# Patient Record
Sex: Female | Born: 1955
Health system: Southern US, Community
[De-identification: ages and names within clinical notes are randomized; demographics above are authoritative.]

## PROBLEM LIST (undated history)

## (undated) DIAGNOSIS — F32A Depression, unspecified: Secondary | ICD-10-CM

## (undated) DIAGNOSIS — F329 Major depressive disorder, single episode, unspecified: Secondary | ICD-10-CM

## (undated) DIAGNOSIS — F909 Attention-deficit hyperactivity disorder, unspecified type: Secondary | ICD-10-CM

## (undated) DIAGNOSIS — M810 Age-related osteoporosis without current pathological fracture: Secondary | ICD-10-CM

## (undated) HISTORY — PX: REDUCTION MAMMAPLASTY: SUR839

## (undated) HISTORY — PX: CHOLECYSTECTOMY: SHX55

## (undated) HISTORY — DX: Age-related osteoporosis without current pathological fracture: M81.0

## (undated) HISTORY — PX: TUBAL LIGATION: SHX77

## (undated) HISTORY — PX: BREAST REDUCTION SURGERY: SHX8

## (undated) HISTORY — DX: Attention-deficit hyperactivity disorder, unspecified type: F90.9

## (undated) HISTORY — DX: Depression, unspecified: F32.A

## (undated) HISTORY — DX: Major depressive disorder, single episode, unspecified: F32.9

---

## 1997-12-29 ENCOUNTER — Other Ambulatory Visit: Admission: RE | Admit: 1997-12-29 | Discharge: 1997-12-29 | Payer: Self-pay | Admitting: *Deleted

## 1999-04-19 ENCOUNTER — Encounter: Admission: RE | Admit: 1999-04-19 | Discharge: 1999-04-19 | Payer: Self-pay | Admitting: *Deleted

## 2000-02-01 ENCOUNTER — Encounter: Admission: RE | Admit: 2000-02-01 | Discharge: 2000-02-01 | Payer: Self-pay | Admitting: *Deleted

## 2000-03-27 ENCOUNTER — Emergency Department (HOSPITAL_COMMUNITY): Admission: EM | Admit: 2000-03-27 | Discharge: 2000-03-27 | Payer: Self-pay

## 2000-03-27 ENCOUNTER — Encounter: Payer: Self-pay | Admitting: Emergency Medicine

## 2000-04-30 ENCOUNTER — Encounter: Admission: RE | Admit: 2000-04-30 | Discharge: 2000-04-30 | Payer: Self-pay | Admitting: Internal Medicine

## 2000-04-30 ENCOUNTER — Encounter: Payer: Self-pay | Admitting: Internal Medicine

## 2000-05-02 ENCOUNTER — Other Ambulatory Visit: Admission: RE | Admit: 2000-05-02 | Discharge: 2000-05-02 | Payer: Self-pay | Admitting: *Deleted

## 2000-05-15 ENCOUNTER — Encounter (INDEPENDENT_AMBULATORY_CARE_PROVIDER_SITE_OTHER): Payer: Self-pay | Admitting: Specialist

## 2000-05-15 ENCOUNTER — Observation Stay (HOSPITAL_COMMUNITY): Admission: RE | Admit: 2000-05-15 | Discharge: 2000-05-16 | Payer: Self-pay | Admitting: *Deleted

## 2001-06-19 ENCOUNTER — Other Ambulatory Visit: Admission: RE | Admit: 2001-06-19 | Discharge: 2001-06-19 | Payer: Self-pay | Admitting: *Deleted

## 2001-06-26 ENCOUNTER — Encounter: Admission: RE | Admit: 2001-06-26 | Discharge: 2001-06-26 | Payer: Self-pay | Admitting: *Deleted

## 2003-05-10 ENCOUNTER — Other Ambulatory Visit: Admission: RE | Admit: 2003-05-10 | Discharge: 2003-05-10 | Payer: Self-pay | Admitting: *Deleted

## 2004-07-05 ENCOUNTER — Encounter: Admission: RE | Admit: 2004-07-05 | Discharge: 2004-07-05 | Payer: Self-pay | Admitting: Internal Medicine

## 2005-02-26 ENCOUNTER — Other Ambulatory Visit: Admission: RE | Admit: 2005-02-26 | Discharge: 2005-02-26 | Payer: Self-pay | Admitting: Obstetrics and Gynecology

## 2005-04-01 ENCOUNTER — Encounter: Admission: RE | Admit: 2005-04-01 | Discharge: 2005-04-01 | Payer: Self-pay | Admitting: Obstetrics and Gynecology

## 2007-09-02 ENCOUNTER — Other Ambulatory Visit: Admission: RE | Admit: 2007-09-02 | Discharge: 2007-09-02 | Payer: Self-pay | Admitting: Internal Medicine

## 2007-09-10 ENCOUNTER — Encounter: Admission: RE | Admit: 2007-09-10 | Discharge: 2007-09-10 | Payer: Self-pay | Admitting: Internal Medicine

## 2009-08-23 ENCOUNTER — Emergency Department (HOSPITAL_COMMUNITY): Admission: EM | Admit: 2009-08-23 | Discharge: 2009-08-23 | Payer: Self-pay | Admitting: Emergency Medicine

## 2010-09-07 NOTE — Op Note (Signed)
[facility]  Patient:    [name], Julie Gregory                        MRN: [id number] Proc. Date: 05/15/00 Attending:  Catalina Lunger, M.D.                           Operative Report  PREOPERATIVE DIAGNOSIS:  Symptomatic cholelithiasis.  POSTOPERATIVE DIAGNOSIS:  Symptomatic cholelithiasis.  PROCEDURE:  Laparoscopic cholecystectomy.  SURGEON:  Danna Hefty, M.D.  ASSISTANT:  Avel Peace, M.D.  ANESTHESIA:  General.  DESCRIPTION OF PROCEDURE:  The patient was taken to the operating room and placed in the supine position. After adequate anesthesia was induced using endotracheal tube, the abdomen was prepped and draped in normal sterile fashion. Using a transverse infraumbilical incision, I dissected down the fascia. The fascia was opened vertically. An #0 Vicryl pursestring suture was placed around the fascial defect. A Hasson trocar was placed in the abdomen and the abdomen was insufflated with carbon dioxide. Under direct visualization, a 10 mm port was placed in the subxiphoid region and two 5 mm ports were placed in the right abdomen. The patient was placed in reverse Trendelenburg. The gallbladder was identified, grasped and retracted cephalad. Dissection began down on the infundibulum until the cystic duct was easily identified at its junction with the gallbladder and common bile duct were identified. The cystic duct was then triply clipped and divided. The cystic artery which lay directly posterior to this was also dissected free, triply clipped and divided. The gallbladder was taken off the gallbladder using Bovie electrocautery. There was virtually no mesentery on the gallbladder and there was a small hole made in the posterior wall of the gallbladder and therefore after it had been completely dissected off the gallbladder bed, it was placed in an endocatch bag and removed through the umbilical port. Adequate hemostasis was  ensured. The right upper quadrant was copiously irrigated. The abdomen was allowed to deflate. The #0 Vicryl pursestring suture closed the fascial defect in the infraumbilical region. All incisions were injected using 0.5 Marcaine. The skin incisions were closed with subcuticular 4-0 monocryl. Dermabond dressing was placed. The patient tolerated the procedure well and went to PACU in good condition. DD:  05/15/00 TD:  05/15/00 Job: [id number]

## 2011-05-09 ENCOUNTER — Other Ambulatory Visit: Payer: Self-pay | Admitting: Internal Medicine

## 2011-05-09 DIAGNOSIS — Z1231 Encounter for screening mammogram for malignant neoplasm of breast: Secondary | ICD-10-CM

## 2011-05-20 ENCOUNTER — Ambulatory Visit
Admission: RE | Admit: 2011-05-20 | Discharge: 2011-05-20 | Disposition: A | Discharge status: Home / Self Care | Payer: 59 | Source: Ambulatory Visit | Attending: Internal Medicine | Admitting: Internal Medicine

## 2011-05-20 DIAGNOSIS — Z1231 Encounter for screening mammogram for malignant neoplasm of breast: Secondary | ICD-10-CM

## 2014-10-06 ENCOUNTER — Other Ambulatory Visit: Payer: Self-pay | Admitting: Internal Medicine

## 2014-10-06 ENCOUNTER — Other Ambulatory Visit: Payer: Self-pay

## 2014-10-06 DIAGNOSIS — Z1231 Encounter for screening mammogram for malignant neoplasm of breast: Secondary | ICD-10-CM

## 2014-10-13 ENCOUNTER — Ambulatory Visit
Admission: RE | Admit: 2014-10-13 | Discharge: 2014-10-13 | Disposition: A | Discharge status: Home / Self Care | Payer: 59 | Source: Ambulatory Visit | Attending: Internal Medicine | Admitting: Internal Medicine

## 2014-10-13 DIAGNOSIS — Z1231 Encounter for screening mammogram for malignant neoplasm of breast: Secondary | ICD-10-CM

## 2014-11-08 ENCOUNTER — Other Ambulatory Visit (HOSPITAL_COMMUNITY)
Admission: RE | Admit: 2014-11-08 | Discharge: 2014-11-08 | Disposition: A | Discharge status: Home / Self Care | Payer: 59 | Source: Ambulatory Visit | Attending: Obstetrics & Gynecology | Admitting: Obstetrics & Gynecology

## 2014-11-08 ENCOUNTER — Other Ambulatory Visit: Payer: Self-pay | Admitting: Obstetrics & Gynecology

## 2014-11-08 DIAGNOSIS — Z1151 Encounter for screening for human papillomavirus (HPV): Secondary | ICD-10-CM | POA: Diagnosis present

## 2014-11-08 DIAGNOSIS — Z01419 Encounter for gynecological examination (general) (routine) without abnormal findings: Secondary | ICD-10-CM | POA: Diagnosis present

## 2014-11-09 LAB — CYTOLOGY - PAP

## 2014-12-23 ENCOUNTER — Encounter: Payer: Self-pay | Admitting: Gastroenterology

## 2015-02-24 ENCOUNTER — Ambulatory Visit (AMBULATORY_SURGERY_CENTER): Payer: Self-pay | Admitting: *Deleted

## 2015-02-24 VITALS — Ht 62.0 in | Wt 215.0 lb

## 2015-02-24 DIAGNOSIS — Z1211 Encounter for screening for malignant neoplasm of colon: Secondary | ICD-10-CM

## 2015-02-24 MED ORDER — NA SULFATE-K SULFATE-MG SULF 17.5-3.13-1.6 GM/177ML PO SOLN: 1.0000 | Freq: Once | ORAL | Status: DC

## 2015-02-24 NOTE — Progress Notes (Signed)
No egg or soy allergy. No anesthesia problems.  No home O2.  No diet meds.  

## 2015-02-28 ENCOUNTER — Encounter: Payer: Self-pay | Admitting: Gastroenterology

## 2015-03-10 ENCOUNTER — Ambulatory Visit (AMBULATORY_SURGERY_CENTER): Payer: Commercial Managed Care - HMO | Admitting: Gastroenterology

## 2015-03-10 ENCOUNTER — Encounter: Payer: Self-pay | Admitting: Gastroenterology

## 2015-03-10 VITALS — BP 120/74 | HR 70 | Temp 97.8°F | Resp 14 | Ht 62.0 in | Wt 215.0 lb

## 2015-03-10 DIAGNOSIS — Z1211 Encounter for screening for malignant neoplasm of colon: Secondary | ICD-10-CM | POA: Diagnosis not present

## 2015-03-10 MED ORDER — SODIUM CHLORIDE 0.9 % IV SOLN: 500.0000 mL | INTRAVENOUS | Status: DC

## 2015-03-10 NOTE — Progress Notes (Signed)
No problems noted in the recovery room. maw 

## 2015-03-10 NOTE — Op Note (Signed)
[place]  [address] [place], [address]   COLONOSCOPY PROCEDURE REPORT  PATIENT: Julie Gregory, Julie Gregory  MR#: [id number] BIRTHDATE: 1956-01-21 , 84  yrs. old GENDER: female ENDOSCOPIST: Yetta Flock, MD REFERRED BY: PROCEDURE DATE:  03/10/2015 PROCEDURE:   Colonoscopy, screening First Screening Colonoscopy - Avg.  risk and is 50 yrs.  old or older Yes.  Prior Negative Screening - Now for repeat screening. N/A  History of Adenoma - Now for follow-up colonoscopy & has been > or = to 3 yrs.  N/A  Recommend repeat exam, <10 yrs? No ASA CLASS:   Class II INDICATIONS:Screening for colonic neoplasia and Colorectal Neoplasm Risk Assessment for this procedure is average risk. MEDICATIONS: Propofol 200 mg IV  DESCRIPTION OF PROCEDURE:   After the risks benefits and alternatives of the procedure were thoroughly explained, informed consent was obtained.  The digital rectal exam revealed no abnormalities of the rectum.   The LB [id number]  endoscope was introduced through the anus and advanced to the cecum, which was identified by both the appendix and ileocecal valve. No adverse events experienced.   The quality of the prep was good.  The instrument was then slowly withdrawn as the colon was fully examined. Estimated blood loss is zero unless otherwise noted in this procedure report.   COLON FINDINGS: A normal appearing cecum, ileocecal valve, and appendiceal orifice were identified.  The ascending, transverse, descending, sigmoid colon, and rectum appeared unremarkable.  No polyps or mas lesions noted. Retroflexed views revealed small internal hemorrhoids. The time to cecum = 3.2 Withdrawal time = 10.5   The scope was withdrawn and the procedure completed. COMPLICATIONS: There were no immediate complications.  ENDOSCOPIC IMPRESSION: Normal colonoscopy  RECOMMENDATIONS: Resume diet Resume medications Repeat colonoscopy in 10 years for screening  purposes  eSigned:  Yetta Flock, MD 03/10/2015 9:52 AM   cc: the patient

## 2015-03-10 NOTE — Progress Notes (Signed)
To recovery, report to Willis, RN, VSS 

## 2015-03-10 NOTE — Patient Instructions (Signed)
YOU HAD AN ENDOSCOPIC PROCEDURE TODAY AT THE [facility]:   Refer to the procedure report that was given to you for any specific questions about what was found during the examination.  If the procedure report does not answer your questions, please call your gastroenterologist to clarify.  If you requested that your care partner not be given the details of your procedure findings, then the procedure report has been included in a sealed envelope for you to review at your convenience later.  YOU SHOULD EXPECT: Some feelings of bloating in the abdomen. Passage of more gas than usual.  Walking can help get rid of the air that was put into your GI tract during the procedure and reduce the bloating. If you had a lower endoscopy (such as a colonoscopy or flexible sigmoidoscopy) you may notice spotting of blood in your stool or on the toilet paper. If you underwent a bowel prep for your procedure, you may not have a normal bowel movement for a few days.  Please Note:  You might notice some irritation and congestion in your nose or some drainage.  This is from the oxygen used during your procedure.  There is no need for concern and it should clear up in a day or so.  SYMPTOMS TO REPORT IMMEDIATELY:   Following lower endoscopy (colonoscopy or flexible sigmoidoscopy):  Excessive amounts of blood in the stool  Significant tenderness or worsening of abdominal pains  Swelling of the abdomen that is new, acute  Fever of 100F or higher   For urgent or emergent issues, a gastroenterologist can be reached at any hour by calling [phone].   DIET: Your first meal following the procedure should be a small meal and then it is ok to progress to your normal diet. Heavy or fried foods are harder to digest and may make you feel nauseous or bloated.  Likewise, meals heavy in dairy and vegetables can increase bloating.  Drink plenty of fluids but you should avoid alcoholic beverages for 24  hours.  ACTIVITY:  You should plan to take it easy for the rest of today and you should NOT DRIVE or use heavy machinery until tomorrow (because of the sedation medicines used during the test).    FOLLOW UP: Our staff will call the number listed on your records the next business day following your procedure to check on you and address any questions or concerns that you may have regarding the information given to you following your procedure. If we do not reach you, we will leave a message.  However, if you are feeling well and you are not experiencing any problems, there is no need to return our call.  We will assume that you have returned to your regular daily activities without incident.  If any biopsies were taken you will be contacted by phone or by letter within the next 1-3 weeks.  Please call us at [phone] if you have not heard about the biopsies in 3 weeks.    SIGNATURES/CONFIDENTIALITY: You and/or your care partner have signed paperwork which will be entered into your electronic medical record.  These signatures attest to the fact that that the information above on your After Visit Summary has been reviewed and is understood.  Full responsibility of the confidentiality of this discharge information lies with you and/or your care-partner.    Handouts were given to your care partner on hemorrhoids and a high fiber diet with liberal fluid intake You may resume your   current medications today. Please call if any questions or concerns.

## 2015-03-13 ENCOUNTER — Telehealth: Payer: Self-pay | Admitting: *Deleted

## 2015-03-13 NOTE — Telephone Encounter (Signed)
  Follow up Call-  Call back number 03/10/2015  Post procedure Call Back phone  # [phone]  Permission to leave phone message Yes     Patient questions:  Do you have a fever, pain , or abdominal swelling? No. Pain Score  0 *  Have you tolerated food without any problems? Yes.    Have you been able to return to your normal activities? Yes.    Do you have any questions about your discharge instructions: Diet   No. Medications  No. Follow up visit  No.  Do you have questions or concerns about your Care? No.  Actions: * If pain score is 4 or above: No action needed, pain <4.

## 2015-11-16 ENCOUNTER — Encounter (HOSPITAL_BASED_OUTPATIENT_CLINIC_OR_DEPARTMENT_OTHER): Payer: Self-pay | Admitting: *Deleted

## 2015-11-16 ENCOUNTER — Emergency Department (HOSPITAL_BASED_OUTPATIENT_CLINIC_OR_DEPARTMENT_OTHER)
Admission: EM | Admit: 2015-11-16 | Discharge: 2015-11-16 | Disposition: A | Discharge status: Home / Self Care | Payer: Commercial Managed Care - HMO | Attending: Emergency Medicine | Admitting: Emergency Medicine

## 2015-11-16 DIAGNOSIS — F329 Major depressive disorder, single episode, unspecified: Secondary | ICD-10-CM | POA: Diagnosis not present

## 2015-11-16 DIAGNOSIS — N390 Urinary tract infection, site not specified: Secondary | ICD-10-CM

## 2015-11-16 DIAGNOSIS — M810 Age-related osteoporosis without current pathological fracture: Secondary | ICD-10-CM | POA: Insufficient documentation

## 2015-11-16 DIAGNOSIS — R319 Hematuria, unspecified: Secondary | ICD-10-CM

## 2015-11-16 DIAGNOSIS — Z79899 Other long term (current) drug therapy: Secondary | ICD-10-CM | POA: Insufficient documentation

## 2015-11-16 LAB — URINALYSIS, ROUTINE W REFLEX MICROSCOPIC
Bilirubin Urine: NEGATIVE
Glucose, UA: NEGATIVE mg/dL
KETONES UR: NEGATIVE mg/dL
NITRITE: NEGATIVE
PH: 6 (ref 5.0–8.0)
Specific Gravity, Urine: 1.021 (ref 1.005–1.030)

## 2015-11-16 LAB — URINE MICROSCOPIC-ADD ON

## 2015-11-16 MED ORDER — FOSFOMYCIN TROMETHAMINE 3 G PO PACK
3.0000 g | PACK | Freq: Once | ORAL | Status: AC
  Administered 2015-11-16: 3 g via ORAL
  Filled 2015-11-16: qty 3

## 2015-11-16 MED ORDER — PHENAZOPYRIDINE HCL 200 MG PO TABS: 200.0000 mg | ORAL_TABLET | Freq: Three times a day (TID) | ORAL | 0 refills | Status: DC

## 2015-11-16 NOTE — ED Provider Notes (Signed)
[facility] DEPT Provider Note   CSN: [id number] Arrival date & time: 11/16/15  2112  First Provider Contact:  First MD Initiated Contact with Patient 11/16/15 2201     By signing my name below, I, Soijett Blue, attest that this documentation has been prepared under the direction and in the presence of Deno Etienne, DO. Electronically Signed: [place], ED Scribe. 11/16/15. 10:05 PM.    History   Chief Complaint Chief Complaint  Patient presents with  . Hematuria    HPI Julie Gregory is a 60 y.o. female who presents to the Emergency Department complaining of hematuria onset today. She states that she is having associated symptoms of urinary frequency and dysuria. She states that she has not tried any medications for the relief for her symptoms. She denies fever, n/v, back pain, and any other symptoms.   The history is provided by the patient. No language interpreter was used.  Hematuria  This is a new problem. The current episode started 12 to 24 hours ago. The problem occurs rarely. The problem has not changed since onset.Nothing aggravates the symptoms. Nothing relieves the symptoms. The treatment provided no relief.    Past Medical History:  Diagnosis Date  . ADHD (attention deficit hyperactivity disorder)   . Depression   . Osteoporosis     There are no active problems to display for this patient.   Past Surgical History:  Procedure Laterality Date  . BREAST REDUCTION SURGERY    . CHOLECYSTECTOMY    . TUBAL LIGATION      OB History    No data available       Home Medications    Prior to Admission medications   Medication Sig Start Date End Date Taking? Authorizing Provider  BuPROPion HCl (WELLBUTRIN PO) Take by mouth.   Yes Historical Provider, MD  alendronate (FOSAMAX) 70 MG tablet Take 70 mg by mouth once a week. Take with a full glass of water on an empty stomach.    Historical Provider, MD  amphetamine-dextroamphetamine (ADDERALL) 30 MG tablet  Take 30 mg by mouth daily.    Historical Provider, MD  Multiple Vitamins-Minerals (MULTIVITAMIN ADULT PO) Take by mouth.    Historical Provider, MD  phenazopyridine (PYRIDIUM) 200 MG tablet Take 1 tablet (200 mg total) by mouth 3 (three) times daily. 11/16/15   Deno Etienne, DO    Family History Family History  Problem Relation Age of Onset  . Colon cancer Neg Hx     Social History Social History  Substance Use Topics  . Smoking status: Never Smoker  . Smokeless tobacco: Never Used  . Alcohol use 0.0 oz/week     Comment: occasional     Allergies   Review of patient's allergies indicates no known allergies.   Review of Systems Review of Systems  Constitutional: Negative for fever.  Gastrointestinal: Negative for nausea and vomiting.  Genitourinary: Positive for dysuria, frequency and hematuria.  Musculoskeletal: Negative for back pain.  All other systems reviewed and are negative.    Physical Exam Updated Vital Signs BP 150/78   Pulse 93   Temp 98.5 F (36.9 C) (Oral)   Resp 20   Ht 5\' 3"  (1.6 m)   Wt 207 lb (93.9 kg)   SpO2 96%   BMI 36.67 kg/m   Physical Exam  Constitutional: She is oriented to person, place, and time. She appears well-developed and well-nourished. No distress.  HENT:  Head: Normocephalic and atraumatic.  Eyes: EOM are normal.  Neck:  Neck supple.  Cardiovascular: Normal rate.   Pulmonary/Chest: Effort normal. No respiratory distress.  Abdominal: Soft. She exhibits no distension. There is no tenderness. There is no CVA tenderness.  No CVA tenderness  Musculoskeletal: Normal range of motion.  Neurological: She is alert and oriented to person, place, and time.  Skin: Skin is warm and dry.  Psychiatric: She has a normal mood and affect. Her behavior is normal.  Nursing note and vitals reviewed.    ED Treatments / Results  DIAGNOSTIC STUDIES: Oxygen Saturation is 96% on RA, nl by my interpretation.    COORDINATION OF CARE: 10:04 PM  Discussed treatment plan with pt at bedside which includes UA and pt agreed to plan.   Labs (all labs ordered are listed, but only abnormal results are displayed) Labs Reviewed  URINALYSIS, ROUTINE W REFLEX MICROSCOPIC (NOT AT [facility]) - Abnormal; Notable for the following:       Result Value   Color, Urine RED (*)    APPearance TURBID (*)    Hgb urine dipstick LARGE (*)    Protein, ur >300 (*)    Leukocytes, UA MODERATE (*)    All other components within normal limits  URINE MICROSCOPIC-ADD ON - Abnormal; Notable for the following:    Squamous Epithelial / LPF 6-30 (*)    Bacteria, UA FEW (*)    All other components within normal limits    Procedures Procedures (including critical care time)  Medications Ordered in ED Medications  fosfomycin (MONUROL) packet 3 g (3 g Oral Given 11/16/15 2220)     Initial Impression / Assessment and Plan / ED Course  I have reviewed the triage vital signs and the nursing notes.  Pertinent labs that were available during my care of the patient were reviewed by me and considered in my medical decision making (see chart for details).  Clinical Course    60 yo F with dysuria.  UA with UTI, given fosfomycin.  PCP follow up.    4:05 PM:  I have discussed the diagnosis/risks/treatment options with the patient and family and believe the pt to be eligible for discharge home to follow-up with PCP. We also discussed returning to the ED immediately if new or worsening sx occur. We discussed the sx which are most concerning (e.g., sudden worsening pain, fever, back pain, continued hematuria) that necessitate immediate return. Medications administered to the patient during their visit and any new prescriptions provided to the patient are listed below.  Medications given during this visit Medications  fosfomycin (MONUROL) packet 3 g (3 g Oral Given 11/16/15 2220)     The patient appears reasonably screen and/or stabilized for discharge and I doubt any  other medical condition or other [facility] requiring further screening, evaluation, or treatment in the ED at this time prior to discharge.    Final Clinical Impressions(s) / ED Diagnoses   Final diagnoses:  UTI (lower urinary tract infection)  Hematuria    New Prescriptions Discharge Medication List as of 11/16/2015 10:06 PM    START taking these medications   Details  phenazopyridine (PYRIDIUM) 200 MG tablet Take 1 tablet (200 mg total) by mouth 3 (three) times daily., Starting Thu 11/16/2015, Print        I personally performed the services described in this documentation, which was scribed in my presence. The recorded information has been reviewed and is accurate.     Deno Etienne, DO 11/17/15 1605

## 2015-11-16 NOTE — ED Triage Notes (Signed)
Hematuria, urgency and frequency today.

## 2015-11-16 NOTE — ED Notes (Signed)
C/o urinary freq,  burning w urination and blood in urine onset this pm

## 2016-01-16 ENCOUNTER — Other Ambulatory Visit: Payer: Self-pay | Admitting: Urology

## 2016-01-16 DIAGNOSIS — R310 Gross hematuria: Secondary | ICD-10-CM

## 2016-01-16 DIAGNOSIS — R3989 Other symptoms and signs involving the genitourinary system: Secondary | ICD-10-CM | POA: Insufficient documentation

## 2016-01-16 DIAGNOSIS — R8290 Unspecified abnormal findings in urine: Secondary | ICD-10-CM

## 2016-01-22 ENCOUNTER — Ambulatory Visit
Admission: RE | Admit: 2016-01-22 | Discharge: 2016-01-22 | Disposition: A | Discharge status: Home / Self Care | Payer: Commercial Managed Care - HMO | Source: Ambulatory Visit | Attending: Urology | Admitting: Urology

## 2016-01-22 ENCOUNTER — Other Ambulatory Visit: Payer: Self-pay | Admitting: Urology

## 2016-01-22 DIAGNOSIS — R8290 Unspecified abnormal findings in urine: Secondary | ICD-10-CM

## 2016-01-22 DIAGNOSIS — R3989 Other symptoms and signs involving the genitourinary system: Secondary | ICD-10-CM

## 2016-01-22 DIAGNOSIS — R310 Gross hematuria: Secondary | ICD-10-CM

## 2016-01-22 MED ORDER — IOPAMIDOL (ISOVUE-300) INJECTION 61%
125.0000 mL | Freq: Once | INTRAVENOUS | Status: AC | PRN
  Administered 2016-01-22: 125 mL via INTRAVENOUS

## 2016-07-26 ENCOUNTER — Other Ambulatory Visit: Payer: Self-pay | Admitting: Internal Medicine

## 2016-07-26 DIAGNOSIS — Z1231 Encounter for screening mammogram for malignant neoplasm of breast: Secondary | ICD-10-CM

## 2016-08-16 ENCOUNTER — Ambulatory Visit
Admission: RE | Admit: 2016-08-16 | Discharge: 2016-08-16 | Disposition: A | Discharge status: Home / Self Care | Payer: Commercial Managed Care - HMO | Source: Ambulatory Visit | Attending: Internal Medicine | Admitting: Internal Medicine

## 2016-08-16 DIAGNOSIS — Z1231 Encounter for screening mammogram for malignant neoplasm of breast: Secondary | ICD-10-CM

## 2016-10-09 DIAGNOSIS — Z0289 Encounter for other administrative examinations: Secondary | ICD-10-CM | POA: Diagnosis not present

## 2016-10-09 DIAGNOSIS — Z79899 Other long term (current) drug therapy: Secondary | ICD-10-CM | POA: Diagnosis not present

## 2017-01-03 DIAGNOSIS — J181 Lobar pneumonia, unspecified organism: Secondary | ICD-10-CM | POA: Diagnosis not present

## 2017-01-10 ENCOUNTER — Other Ambulatory Visit: Payer: Self-pay | Admitting: Nurse Practitioner

## 2017-01-10 ENCOUNTER — Ambulatory Visit
Admission: RE | Admit: 2017-01-10 | Discharge: 2017-01-10 | Disposition: A | Discharge status: Home / Self Care | Payer: Self-pay | Source: Ambulatory Visit | Attending: Nurse Practitioner | Admitting: Nurse Practitioner

## 2017-01-10 DIAGNOSIS — J189 Pneumonia, unspecified organism: Secondary | ICD-10-CM

## 2017-01-10 DIAGNOSIS — R05 Cough: Secondary | ICD-10-CM | POA: Diagnosis not present

## 2017-01-10 DIAGNOSIS — J153 Pneumonia due to streptococcus, group B: Secondary | ICD-10-CM | POA: Diagnosis not present

## 2017-02-06 DIAGNOSIS — M8588 Other specified disorders of bone density and structure, other site: Secondary | ICD-10-CM | POA: Diagnosis not present

## 2017-02-06 DIAGNOSIS — M810 Age-related osteoporosis without current pathological fracture: Secondary | ICD-10-CM | POA: Diagnosis not present

## 2017-02-14 DIAGNOSIS — Z0000 Encounter for general adult medical examination without abnormal findings: Secondary | ICD-10-CM | POA: Diagnosis not present

## 2017-02-14 DIAGNOSIS — M810 Age-related osteoporosis without current pathological fracture: Secondary | ICD-10-CM | POA: Diagnosis not present

## 2017-03-26 DIAGNOSIS — Z01419 Encounter for gynecological examination (general) (routine) without abnormal findings: Secondary | ICD-10-CM | POA: Diagnosis not present

## 2017-07-19 DIAGNOSIS — J018 Other acute sinusitis: Secondary | ICD-10-CM | POA: Diagnosis not present

## 2017-07-19 DIAGNOSIS — J302 Other seasonal allergic rhinitis: Secondary | ICD-10-CM | POA: Diagnosis not present

## 2017-07-19 DIAGNOSIS — R030 Elevated blood-pressure reading, without diagnosis of hypertension: Secondary | ICD-10-CM | POA: Diagnosis not present

## 2017-08-05 DIAGNOSIS — J0100 Acute maxillary sinusitis, unspecified: Secondary | ICD-10-CM | POA: Diagnosis not present

## 2017-08-05 DIAGNOSIS — J209 Acute bronchitis, unspecified: Secondary | ICD-10-CM | POA: Diagnosis not present

## 2017-12-16 ENCOUNTER — Other Ambulatory Visit: Payer: Self-pay | Admitting: Internal Medicine

## 2017-12-16 DIAGNOSIS — Z1231 Encounter for screening mammogram for malignant neoplasm of breast: Secondary | ICD-10-CM

## 2017-12-26 DIAGNOSIS — L570 Actinic keratosis: Secondary | ICD-10-CM | POA: Diagnosis not present

## 2017-12-26 DIAGNOSIS — I788 Other diseases of capillaries: Secondary | ICD-10-CM | POA: Diagnosis not present

## 2017-12-26 DIAGNOSIS — L430 Hypertrophic lichen planus: Secondary | ICD-10-CM | POA: Diagnosis not present

## 2017-12-26 DIAGNOSIS — L821 Other seborrheic keratosis: Secondary | ICD-10-CM | POA: Diagnosis not present

## 2017-12-26 DIAGNOSIS — D485 Neoplasm of uncertain behavior of skin: Secondary | ICD-10-CM | POA: Diagnosis not present

## 2017-12-26 DIAGNOSIS — L814 Other melanin hyperpigmentation: Secondary | ICD-10-CM | POA: Diagnosis not present

## 2018-01-08 ENCOUNTER — Ambulatory Visit
Admission: RE | Admit: 2018-01-08 | Discharge: 2018-01-08 | Disposition: A | Discharge status: Home / Self Care | Payer: 59 | Source: Ambulatory Visit | Attending: Internal Medicine | Admitting: Internal Medicine

## 2018-01-08 ENCOUNTER — Ambulatory Visit: Payer: 59

## 2018-01-08 DIAGNOSIS — Z1231 Encounter for screening mammogram for malignant neoplasm of breast: Secondary | ICD-10-CM | POA: Diagnosis not present

## 2018-02-18 DIAGNOSIS — Z136 Encounter for screening for cardiovascular disorders: Secondary | ICD-10-CM | POA: Diagnosis not present

## 2018-02-18 DIAGNOSIS — Z0000 Encounter for general adult medical examination without abnormal findings: Secondary | ICD-10-CM | POA: Diagnosis not present

## 2018-02-18 DIAGNOSIS — Z23 Encounter for immunization: Secondary | ICD-10-CM | POA: Diagnosis not present

## 2018-03-30 DIAGNOSIS — J0100 Acute maxillary sinusitis, unspecified: Secondary | ICD-10-CM | POA: Diagnosis not present

## 2018-03-30 DIAGNOSIS — J029 Acute pharyngitis, unspecified: Secondary | ICD-10-CM | POA: Diagnosis not present

## 2018-03-30 DIAGNOSIS — J040 Acute laryngitis: Secondary | ICD-10-CM | POA: Diagnosis not present

## 2018-04-06 DIAGNOSIS — Z01419 Encounter for gynecological examination (general) (routine) without abnormal findings: Secondary | ICD-10-CM | POA: Diagnosis not present

## 2019-01-20 ENCOUNTER — Other Ambulatory Visit (HOSPITAL_COMMUNITY): Payer: Self-pay | Admitting: Orthopedic Surgery

## 2019-02-25 ENCOUNTER — Encounter (HOSPITAL_BASED_OUTPATIENT_CLINIC_OR_DEPARTMENT_OTHER): Payer: Self-pay | Admitting: *Deleted

## 2019-02-25 ENCOUNTER — Other Ambulatory Visit: Payer: Self-pay

## 2019-02-26 ENCOUNTER — Encounter (HOSPITAL_BASED_OUTPATIENT_CLINIC_OR_DEPARTMENT_OTHER)
Admission: RE | Admit: 2019-02-26 | Discharge: 2019-02-26 | Disposition: A | Discharge status: Home / Self Care | Payer: 59 | Source: Ambulatory Visit | Attending: Orthopedic Surgery | Admitting: Orthopedic Surgery

## 2019-02-26 DIAGNOSIS — Z01812 Encounter for preprocedural laboratory examination: Secondary | ICD-10-CM | POA: Diagnosis not present

## 2019-02-26 LAB — SURGICAL PCR SCREEN
MRSA, PCR: NEGATIVE
Staphylococcus aureus: NEGATIVE

## 2019-02-26 NOTE — Progress Notes (Signed)

## 2019-03-01 ENCOUNTER — Other Ambulatory Visit (HOSPITAL_COMMUNITY)
Admission: RE | Admit: 2019-03-01 | Discharge: 2019-03-01 | Disposition: A | Discharge status: Home / Self Care | Payer: 59 | Source: Ambulatory Visit | Attending: Orthopedic Surgery | Admitting: Orthopedic Surgery

## 2019-03-01 DIAGNOSIS — Z20828 Contact with and (suspected) exposure to other viral communicable diseases: Secondary | ICD-10-CM | POA: Diagnosis not present

## 2019-03-01 DIAGNOSIS — Z01812 Encounter for preprocedural laboratory examination: Secondary | ICD-10-CM | POA: Insufficient documentation

## 2019-03-02 LAB — NOVEL CORONAVIRUS, NAA (HOSP ORDER, SEND-OUT TO REF LAB; TAT 18-24 HRS): SARS-CoV-2, NAA: NOT DETECTED

## 2019-03-04 ENCOUNTER — Ambulatory Visit (HOSPITAL_BASED_OUTPATIENT_CLINIC_OR_DEPARTMENT_OTHER): Payer: 59 | Admitting: Certified Registered"

## 2019-03-04 ENCOUNTER — Encounter (HOSPITAL_BASED_OUTPATIENT_CLINIC_OR_DEPARTMENT_OTHER)
Admission: RE | Disposition: A | Discharge status: Home / Self Care | Payer: Self-pay | Source: Home / Self Care | Attending: Orthopedic Surgery

## 2019-03-04 ENCOUNTER — Ambulatory Visit (HOSPITAL_COMMUNITY): Payer: 59

## 2019-03-04 ENCOUNTER — Other Ambulatory Visit: Payer: Self-pay

## 2019-03-04 ENCOUNTER — Encounter (HOSPITAL_BASED_OUTPATIENT_CLINIC_OR_DEPARTMENT_OTHER): Payer: Self-pay | Admitting: Certified Registered"

## 2019-03-04 ENCOUNTER — Ambulatory Visit (HOSPITAL_BASED_OUTPATIENT_CLINIC_OR_DEPARTMENT_OTHER)
Admission: RE | Admit: 2019-03-04 | Discharge: 2019-03-05 | Disposition: A | Discharge status: Home / Self Care | Payer: 59 | Attending: Orthopedic Surgery | Admitting: Orthopedic Surgery

## 2019-03-04 DIAGNOSIS — Z79899 Other long term (current) drug therapy: Secondary | ICD-10-CM | POA: Insufficient documentation

## 2019-03-04 DIAGNOSIS — M19171 Post-traumatic osteoarthritis, right ankle and foot: Secondary | ICD-10-CM | POA: Diagnosis not present

## 2019-03-04 DIAGNOSIS — F329 Major depressive disorder, single episode, unspecified: Secondary | ICD-10-CM | POA: Diagnosis not present

## 2019-03-04 DIAGNOSIS — Z419 Encounter for procedure for purposes other than remedying health state, unspecified: Secondary | ICD-10-CM

## 2019-03-04 HISTORY — PX: TOTAL ANKLE ARTHROPLASTY: SHX811

## 2019-03-04 SURGERY — ARTHROPLASTY, ANKLE, TOTAL
Anesthesia: General | Site: Ankle | Laterality: Right

## 2019-03-04 MED ORDER — METOCLOPRAMIDE HCL 5 MG/ML IJ SOLN: 5.0000 mg | Freq: Three times a day (TID) | INTRAMUSCULAR | Status: DC | PRN

## 2019-03-04 MED ORDER — HYDROMORPHONE HCL 1 MG/ML IJ SOLN: 0.5000 mg | INTRAMUSCULAR | Status: DC | PRN

## 2019-03-04 MED ORDER — OXYCODONE HCL 5 MG/5ML PO SOLN: 5.0000 mg | Freq: Once | ORAL | Status: DC | PRN

## 2019-03-04 MED ORDER — FENTANYL CITRATE (PF) 100 MCG/2ML IJ SOLN: 25.0000 ug | INTRAMUSCULAR | Status: DC | PRN

## 2019-03-04 MED ORDER — PROPOFOL 10 MG/ML IV BOLUS
INTRAVENOUS | Status: DC | PRN
  Administered 2019-03-04: 150 mg via INTRAVENOUS

## 2019-03-04 MED ORDER — METHOCARBAMOL 1000 MG/10ML IJ SOLN: 500.0000 mg | Freq: Four times a day (QID) | INTRAVENOUS | Status: DC | PRN

## 2019-03-04 MED ORDER — CEFAZOLIN SODIUM-DEXTROSE 2-4 GM/100ML-% IV SOLN
2.0000 g | INTRAVENOUS | Status: AC
  Administered 2019-03-04: 2 g via INTRAVENOUS

## 2019-03-04 MED ORDER — OXYCODONE HCL 5 MG PO TABS: 5.0000 mg | ORAL_TABLET | Freq: Once | ORAL | Status: DC | PRN

## 2019-03-04 MED ORDER — OXYCODONE HCL 5 MG PO TABS: 10.0000 mg | ORAL_TABLET | ORAL | Status: DC | PRN

## 2019-03-04 MED ORDER — CHLORHEXIDINE GLUCONATE 4 % EX LIQD: 60.0000 mL | Freq: Once | CUTANEOUS | Status: DC

## 2019-03-04 MED ORDER — ONDANSETRON HCL 4 MG/2ML IJ SOLN
INTRAMUSCULAR | Status: DC | PRN
  Administered 2019-03-04: 4 mg via INTRAVENOUS

## 2019-03-04 MED ORDER — MIDAZOLAM HCL 2 MG/2ML IJ SOLN
1.0000 mg | INTRAMUSCULAR | Status: DC | PRN
  Administered 2019-03-04: 07:00:00 2 mg via INTRAVENOUS

## 2019-03-04 MED ORDER — LACTATED RINGERS IV SOLN
INTRAVENOUS | Status: DC
  Administered 2019-03-04 (×2): via INTRAVENOUS

## 2019-03-04 MED ORDER — ONDANSETRON HCL 4 MG/2ML IJ SOLN: 4.0000 mg | Freq: Once | INTRAMUSCULAR | Status: DC | PRN

## 2019-03-04 MED ORDER — ONDANSETRON HCL 4 MG PO TABS: 4.0000 mg | ORAL_TABLET | Freq: Four times a day (QID) | ORAL | Status: DC | PRN

## 2019-03-04 MED ORDER — ROPIVACAINE HCL 5 MG/ML IJ SOLN
INTRAMUSCULAR | Status: DC | PRN
  Administered 2019-03-04: 30 mL via PERINEURAL
  Administered 2019-03-04: 15 mL via PERINEURAL

## 2019-03-04 MED ORDER — SENNA 8.6 MG PO TABS: 1.0000 | ORAL_TABLET | Freq: Two times a day (BID) | ORAL | Status: DC

## 2019-03-04 MED ORDER — LORATADINE 10 MG PO TABS: 10.0000 mg | ORAL_TABLET | Freq: Every day | ORAL | Status: DC

## 2019-03-04 MED ORDER — OXYCODONE HCL 5 MG PO TABS: 5.0000 mg | ORAL_TABLET | ORAL | Status: DC | PRN

## 2019-03-04 MED ORDER — LIDOCAINE HCL (CARDIAC) PF 100 MG/5ML IV SOSY
PREFILLED_SYRINGE | INTRAVENOUS | Status: DC | PRN
  Administered 2019-03-04: 30 mg via INTRAVENOUS

## 2019-03-04 MED ORDER — MAGNESIUM 400 MG PO TABS: ORAL_TABLET | Freq: Every morning | ORAL | Status: DC

## 2019-03-04 MED ORDER — SENNA 8.6 MG PO TABS: 2.0000 | ORAL_TABLET | Freq: Two times a day (BID) | ORAL | 0 refills | Status: AC

## 2019-03-04 MED ORDER — VANCOMYCIN HCL 500 MG IV SOLR
INTRAVENOUS | Status: DC | PRN
  Administered 2019-03-04: 500 mg via TOPICAL

## 2019-03-04 MED ORDER — ONDANSETRON HCL 4 MG/2ML IJ SOLN: 4.0000 mg | Freq: Four times a day (QID) | INTRAMUSCULAR | Status: DC | PRN

## 2019-03-04 MED ORDER — SODIUM CHLORIDE 0.9 % IV SOLN
INTRAVENOUS | Status: DC
  Administered 2019-03-04: 11:00:00 via INTRAVENOUS

## 2019-03-04 MED ORDER — SODIUM CHLORIDE 0.9 % IV SOLN: INTRAVENOUS | Status: DC

## 2019-03-04 MED ORDER — METOCLOPRAMIDE HCL 5 MG PO TABS: 5.0000 mg | ORAL_TABLET | Freq: Three times a day (TID) | ORAL | Status: DC | PRN

## 2019-03-04 MED ORDER — DOCUSATE SODIUM 100 MG PO CAPS: 100.0000 mg | ORAL_CAPSULE | Freq: Two times a day (BID) | ORAL | 0 refills | Status: AC

## 2019-03-04 MED ORDER — METHOCARBAMOL 500 MG PO TABS: 500.0000 mg | ORAL_TABLET | Freq: Four times a day (QID) | ORAL | Status: DC | PRN

## 2019-03-04 MED ORDER — DEXAMETHASONE SODIUM PHOSPHATE 10 MG/ML IJ SOLN
INTRAMUSCULAR | Status: DC | PRN
  Administered 2019-03-04: 5 mg via INTRAVENOUS
  Administered 2019-03-04: 5 mg

## 2019-03-04 MED ORDER — MIDAZOLAM HCL 2 MG/2ML IJ SOLN
INTRAMUSCULAR | Status: AC
  Filled 2019-03-04: qty 2

## 2019-03-04 MED ORDER — POTASSIUM AMINOBENZOATE 500 MG PO CAPS: ORAL_CAPSULE | Freq: Every morning | ORAL | Status: DC

## 2019-03-04 MED ORDER — FENTANYL CITRATE (PF) 100 MCG/2ML IJ SOLN
INTRAMUSCULAR | Status: AC
  Filled 2019-03-04: qty 2

## 2019-03-04 MED ORDER — CEFAZOLIN SODIUM-DEXTROSE 2-4 GM/100ML-% IV SOLN
INTRAVENOUS | Status: AC
  Filled 2019-03-04: qty 100

## 2019-03-04 MED ORDER — ASPIRIN EC 81 MG PO TBEC: 81.0000 mg | DELAYED_RELEASE_TABLET | Freq: Two times a day (BID) | ORAL | 0 refills | Status: AC

## 2019-03-04 MED ORDER — FENTANYL CITRATE (PF) 100 MCG/2ML IJ SOLN
50.0000 ug | INTRAMUSCULAR | Status: DC | PRN
  Administered 2019-03-04: 07:00:00 50 ug via INTRAVENOUS

## 2019-03-04 MED ORDER — ACETAMINOPHEN 325 MG PO TABS: 325.0000 mg | ORAL_TABLET | Freq: Four times a day (QID) | ORAL | Status: DC | PRN

## 2019-03-04 MED ORDER — DOCUSATE SODIUM 100 MG PO CAPS: 100.0000 mg | ORAL_CAPSULE | Freq: Two times a day (BID) | ORAL | Status: DC

## 2019-03-04 MED ORDER — PROPOFOL 500 MG/50ML IV EMUL
INTRAVENOUS | Status: DC | PRN
  Administered 2019-03-04: 25 ug/kg/min via INTRAVENOUS

## 2019-03-04 MED ORDER — OXYCODONE HCL 5 MG PO TABS: 5.0000 mg | ORAL_TABLET | ORAL | 0 refills | Status: AC | PRN

## 2019-03-04 SURGICAL SUPPLY — 89 items
APL PRP STRL LF DISP 70% ISPRP (MISCELLANEOUS) ×1
BANDAGE ESMARK 6X9 LF (GAUZE/BANDAGES/DRESSINGS) IMPLANT
BIT DRILL 2.9 CANN QC NONSTRL (BIT) ×2 IMPLANT
BLADE RECIPRO TAPERED (BLADE) ×3 IMPLANT
BLADE SURG 15 STRL LF DISP TIS (BLADE) ×3 IMPLANT
BLADE SURG 15 STRL SS (BLADE) ×9
BNDG CMPR 9X6 STRL LF SNTH (GAUZE/BANDAGES/DRESSINGS)
BNDG COHESIVE 4X5 TAN STRL (GAUZE/BANDAGES/DRESSINGS) ×3 IMPLANT
BNDG ELASTIC 4X5.8 VLCR STR LF (GAUZE/BANDAGES/DRESSINGS) ×3 IMPLANT
BNDG ELASTIC 6X5.8 VLCR STR LF (GAUZE/BANDAGES/DRESSINGS) IMPLANT
BNDG ESMARK 6X9 LF (GAUZE/BANDAGES/DRESSINGS)
CHLORAPREP W/TINT 26 (MISCELLANEOUS) ×3 IMPLANT
CLIP LOCKING VANTAGE SZ 2 (Clip) ×2 IMPLANT
COMP TALAR VT SZ 2 RT (Ankle) ×3 IMPLANT
COMPONENT TALAR VT SZ 2 RT (Ankle) IMPLANT
COVER BACK TABLE REUSABLE LG (DRAPES) ×6 IMPLANT
COVER MAYO STAND REUSABLE (DRAPES) ×3 IMPLANT
COVER WAND RF STERILE (DRAPES) IMPLANT
CUFF TOURN SGL QUICK 34 (TOURNIQUET CUFF) ×3
CUFF TRNQT CYL 34X4.125X (TOURNIQUET CUFF) IMPLANT
DECANTER SPIKE VIAL GLASS SM (MISCELLANEOUS) IMPLANT
DRAPE C-ARM 42X72 X-RAY (DRAPES) ×3 IMPLANT
DRAPE C-ARMOR (DRAPES) ×3 IMPLANT
DRAPE EXTREMITY T 121X128X90 (DISPOSABLE) ×3 IMPLANT
DRAPE HALF SHEET 70X43 (DRAPES) ×3 IMPLANT
DRAPE IMP U-DRAPE 54X76 (DRAPES) ×2 IMPLANT
DRAPE OEC MINIVIEW 54X84 (DRAPES) IMPLANT
DRAPE U-SHAPE 47X51 STRL (DRAPES) ×3 IMPLANT
DRSG MEPILEX BORDER 4X4 (GAUZE/BANDAGES/DRESSINGS) ×3 IMPLANT
DRSG MEPILEX BORDER 4X8 (GAUZE/BANDAGES/DRESSINGS) IMPLANT
DRSG MEPITEL 4X7.2 (GAUZE/BANDAGES/DRESSINGS) ×3 IMPLANT
DRSG PAD ABDOMINAL 8X10 ST (GAUZE/BANDAGES/DRESSINGS) ×6 IMPLANT
ELECT REM PT RETURN 9FT ADLT (ELECTROSURGICAL) ×3
ELECTRODE REM PT RTRN 9FT ADLT (ELECTROSURGICAL) ×1 IMPLANT
GAUZE SPONGE 4X4 12PLY STRL (GAUZE/BANDAGES/DRESSINGS) ×3 IMPLANT
GLOVE BIO SURGEON STRL SZ8 (GLOVE) ×3 IMPLANT
GLOVE BIOGEL PI IND STRL 7.0 (GLOVE) IMPLANT
GLOVE BIOGEL PI IND STRL 8 (GLOVE) ×2 IMPLANT
GLOVE BIOGEL PI INDICATOR 7.0 (GLOVE) ×4
GLOVE BIOGEL PI INDICATOR 8 (GLOVE) ×4
GLOVE ECLIPSE 6.5 STRL STRAW (GLOVE) ×2 IMPLANT
GLOVE ECLIPSE 8.0 STRL XLNG CF (GLOVE) ×3 IMPLANT
GOWN STRL REUS W/ TWL LRG LVL3 (GOWN DISPOSABLE) ×1 IMPLANT
GOWN STRL REUS W/ TWL XL LVL3 (GOWN DISPOSABLE) ×1 IMPLANT
GOWN STRL REUS W/TWL LRG LVL3 (GOWN DISPOSABLE) ×3
GOWN STRL REUS W/TWL XL LVL3 (GOWN DISPOSABLE) ×6 IMPLANT
INSERT TIB FB ANKLE SZ 2X10 RT (Miscellaneous) ×2 IMPLANT
K-WIRE ACE 1.6X6 (WIRE) ×3
KWIRE ACE 1.6X6 (WIRE) IMPLANT
NEEDLE HYPO 22GX1.5 SAFETY (NEEDLE) IMPLANT
NS IRRIG 1000ML POUR BTL (IV SOLUTION) ×3 IMPLANT
PACK BASIN DAY SURGERY FS (CUSTOM PROCEDURE TRAY) ×3 IMPLANT
PAD CAST 4YDX4 CTTN HI CHSV (CAST SUPPLIES) ×2 IMPLANT
PADDING CAST ABS 4INX4YD NS (CAST SUPPLIES)
PADDING CAST ABS COTTON 4X4 ST (CAST SUPPLIES) IMPLANT
PADDING CAST COTTON 4X4 STRL (CAST SUPPLIES) ×6
PADDING CAST COTTON 6X4 STRL (CAST SUPPLIES) ×3 IMPLANT
PENCIL SMOKE EVACUATOR (MISCELLANEOUS) ×3 IMPLANT
PIN POUCH TALAR VANTAGE 2.0 (PIN) ×2 IMPLANT
PIN POUCH TALAR VANTAGE 2.5 (PIN) ×2 IMPLANT
PIN POUCH TALAR VANTAGE 3.5 (PIN) ×4 IMPLANT
PLATE TIB BEARING VT SZ 2 RT (Plate) ×2 IMPLANT
POUCH SCREW TALAR ANKLE (ORTHOPEDIC DISPOSABLE SUPPLIES) ×2 IMPLANT
SANITIZER HAND PURELL 535ML FO (MISCELLANEOUS) ×3 IMPLANT
SAW BLADE (BLADE) ×3 IMPLANT
SAW STRYKER ANKLE 10X75X1.19 (BLADE) ×1 IMPLANT
SCOTCHCAST PLUS 3X4 WHITE (CAST SUPPLIES) IMPLANT
SCOTCHCAST PLUS 4X4 WHITE (CAST SUPPLIES) ×6 IMPLANT
SCREW ACE CAN 4.0 40M (Screw) ×2 IMPLANT
SLEEVE SCD COMPRESS KNEE MED (MISCELLANEOUS) ×3 IMPLANT
SPONGE LAP 18X18 RF (DISPOSABLE) ×3 IMPLANT
STOCKINETTE 6  STRL (DRAPES) ×2
STOCKINETTE 6 STRL (DRAPES) ×1 IMPLANT
SUCTION FRAZIER HANDLE 10FR (MISCELLANEOUS) ×2
SUCTION TUBE FRAZIER 10FR DISP (MISCELLANEOUS) ×1 IMPLANT
SURGILUBE 2OZ TUBE FLIPTOP (MISCELLANEOUS) ×3 IMPLANT
SUT ETHILON 3 0 PS 1 (SUTURE) ×6 IMPLANT
SUT MNCRL AB 3-0 PS2 18 (SUTURE) ×6 IMPLANT
SUT VIC AB 0 CT1 27 (SUTURE) ×3
SUT VIC AB 0 CT1 27XBRD ANBCTR (SUTURE) ×1 IMPLANT
SUT VIC AB 2-0 SH 27 (SUTURE)
SUT VIC AB 2-0 SH 27XBRD (SUTURE) IMPLANT
SYR 20ML LL LF (SYRINGE) IMPLANT
SYR BULB IRRIGATION 50ML (SYRINGE) ×3 IMPLANT
TOWEL GREEN STERILE FF (TOWEL DISPOSABLE) ×10 IMPLANT
TUBE CONNECTING 20'X1/4 (TUBING) ×1
TUBE CONNECTING 20X1/4 (TUBING) ×2 IMPLANT
UNDERPAD 30X36 HEAVY ABSORB (UNDERPADS AND DIAPERS) ×3 IMPLANT
YANKAUER SUCT BULB TIP NO VENT (SUCTIONS) ×3 IMPLANT

## 2019-03-04 NOTE — Anesthesia Postprocedure Evaluation (Signed)
Anesthesia Post Note  Patient: Julie Gregory  Procedure(s) Performed: TOTAL ANKLE ARTHOPLASTY (Right Ankle)     Patient location during evaluation: PACU Anesthesia Type: General Level of consciousness: awake and alert Pain management: pain level controlled Vital Signs Assessment: post-procedure vital signs reviewed and stable Respiratory status: spontaneous breathing, nonlabored ventilation and respiratory function stable Cardiovascular status: blood pressure returned to baseline and stable Postop Assessment: no apparent nausea or vomiting Anesthetic complications: no    Last Vitals:  Vitals:   03/04/19 1100 03/04/19 1200  BP: 115/64 119/65  Pulse: 76 78  Resp: 18   Temp: (!) 36.4 C   SpO2: 97% 98%    Last Pain:  Vitals:   03/04/19 1200  TempSrc:   PainSc: 0-No pain                 Lidia Collum

## 2019-03-04 NOTE — Anesthesia Procedure Notes (Signed)
Anesthesia Regional Block: Adductor canal block   Pre-Anesthetic Checklist: ,, timeout performed, Correct Patient, Correct Site, Correct Laterality, Correct Procedure, Correct Position, site marked, Risks and benefits discussed,  Surgical consent,  Pre-op evaluation,  At surgeon's request and post-op pain management  Laterality: Right  Prep: chloraprep       Needles:  Injection technique: Single-shot  Needle Type: Echogenic Stimulator Needle     Needle Length: 10cm  Needle Gauge: 21     Additional Needles:   Procedures:,,,, ultrasound used (permanent image in chart),,,,  Narrative:  Start time: 03/04/2019 7:05 AM End time: 03/04/2019 7:10 AM Injection made incrementally with aspirations every 5 mL.  Performed by: Personally  Anesthesiologist: Lidia Collum, MD  Additional Notes: Monitors applied. Injection made in 5cc increments. No resistance to injection. Good needle visualization. Patient tolerated procedure well.

## 2019-03-04 NOTE — H&P (Signed)
Julie Gregory is an 63 y.o. female.   Chief Complaint:  Right ankle pain HPI:  The patient is a 63 y/o female without significant PMH.  She has a long h/o R ankle pain due to post traumatic arthritis.  SHe has failed non op treatment to date including activity modification, oral NSAIDs, bracing and therapy and presents now for right total ankle replacement.  Past Medical History:  Diagnosis Date  . ADHD (attention deficit hyperactivity disorder)   . Depression   . Osteoporosis     Past Surgical History:  Procedure Laterality Date  . BREAST REDUCTION SURGERY    . CHOLECYSTECTOMY    . REDUCTION MAMMAPLASTY    . TUBAL LIGATION      Family History  Problem Relation Age of Onset  . Colon cancer Neg Hx   . Breast cancer Neg Hx    Social History:  reports that she has never smoked. She has never used smokeless tobacco. She reports current alcohol use. She reports that she does not use drugs.  Allergies: No Known Allergies  Medications Prior to Admission  Medication Sig Dispense Refill  . b complex vitamins capsule Take 1 capsule by mouth daily.    . BuPROPion HCl (WELLBUTRIN PO) Take by mouth.    . calcium-vitamin D (OSCAL WITH D) 500-200 MG-UNIT tablet Take 1 tablet by mouth.    . Lactobacillus (PROBIOTIC ACIDOPHILUS PO) Take by mouth.    . loratadine (CLARITIN) 10 MG tablet Take 10 mg by mouth daily.    . Magnesium 400 MG TABS Take by mouth.    . Multiple Vitamins-Minerals (MULTIVITAMIN ADULT PO) Take by mouth.    . Potassium Aminobenzoate 500 MG CAPS Take by mouth.    . Turmeric 500 MG CAPS Take by mouth.    . Zinc 50 MG CAPS Take by mouth.      No results found for this or any previous visit (from the past 48 hour(s)). No results found.  ROS  No recent f/c/n/v/w tloss  Blood pressure 116/64, pulse 75, temperature 98.3 F (36.8 C), temperature source Oral, resp. rate 11, height 5\' 3"  (1.6 m), weight 88.6 kg, SpO2 98 %. Physical Exam  wn wd woman in nad.  A and O x 4.   Mood and affect normal.  EOMI.  resp unalbored.  R ankle with healthy skin.  No lymphadneopathy.  Decreased ROM at the ankel.  5/5 strength in PF and DF of the ankle and toes.  Sens to LT intact anteriorly.  Assessment/Plan R ankle post traumatic arthritis - to OR for right total ankle replacement.  The risks and benefits of the alternative treatment options have been discussed in detail.  The patient wishes to proceed with surgery and specifically understands risks of bleeding, infection, nerve damage, blood clots, need for additional surgery, amputation and death.   [name], MD 04-03-19, 7:23 AM

## 2019-03-04 NOTE — Anesthesia Procedure Notes (Signed)
Anesthesia Regional Block: Popliteal block   Pre-Anesthetic Checklist: ,, timeout performed, Correct Patient, Correct Site, Correct Laterality, Correct Procedure, Correct Position, site marked, Risks and benefits discussed,  Surgical consent,  Pre-op evaluation,  At surgeon's request and post-op pain management  Laterality: Right  Prep: chloraprep       Needles:  Injection technique: Single-shot  Needle Type: Echogenic Stimulator Needle     Needle Length: 10cm  Needle Gauge: 21     Additional Needles:   Procedures:,,,, ultrasound used (permanent image in chart),,,,  Narrative:  Start time: 03/04/2019 7:10 AM End time: 03/04/2019 7:12 AM Injection made incrementally with aspirations every 5 mL.  Performed by: Personally  Anesthesiologist: Lidia Collum, MD  Additional Notes: Monitors applied. Injection made in 5cc increments. No resistance to injection. Good needle visualization. Patient tolerated procedure well.

## 2019-03-04 NOTE — Anesthesia Preprocedure Evaluation (Addendum)
Anesthesia Evaluation  Patient identified by MRN, date of birth, ID band Patient awake    Reviewed: Allergy & Precautions, NPO status , Patient's Chart, lab work & pertinent test results  History of Anesthesia Complications Negative for: history of anesthetic complications  Airway Mallampati: II  TM Distance: >3 FB Neck ROM: Full    Dental  (+) Teeth Intact   Pulmonary neg pulmonary ROS,    Pulmonary exam normal        Cardiovascular negative cardio ROS Normal cardiovascular exam     Neuro/Psych PSYCHIATRIC DISORDERS Depression negative neurological ROS     GI/Hepatic negative GI ROS, Neg liver ROS,   Endo/Other  negative endocrine ROS  Renal/GU negative Renal ROS  negative genitourinary   Musculoskeletal negative musculoskeletal ROS (+)   Abdominal   Peds  Hematology negative hematology ROS (+)   Anesthesia Other Findings   Reproductive/Obstetrics                            Anesthesia Physical Anesthesia Plan  ASA: II  Anesthesia Plan: General   Post-op Pain Management: GA combined w/ Regional for post-op pain   Induction: Intravenous  PONV Risk Score and Plan: 3 and Ondansetron, Dexamethasone, Treatment may vary due to age or medical condition and Midazolam  Airway Management Planned: LMA  Additional Equipment: None  Intra-op Plan:   Post-operative Plan: Extubation in OR  Informed Consent: I have reviewed the patients History and Physical, chart, labs and discussed the procedure including the risks, benefits and alternatives for the proposed anesthesia with the patient or authorized representative who has indicated his/her understanding and acceptance.     Dental advisory given  Plan Discussed with:   Anesthesia Plan Comments:        Anesthesia Quick Evaluation

## 2019-03-04 NOTE — Progress Notes (Signed)
Assisted Dr. Christella Hartigan with right, ultrasound guided, popliteal, adductor canal block. Side rails up, monitors on throughout procedure. See vital signs in flow sheet. Tolerated Procedure well.

## 2019-03-04 NOTE — Anesthesia Procedure Notes (Signed)
Procedure Name: LMA Insertion Date/Time: 03/04/2019 7:33 AM Performed by: Signe Colt, CRNA Pre-anesthesia Checklist: Patient identified, Emergency Drugs available, Suction available and Patient being monitored Patient Re-evaluated:Patient Re-evaluated prior to induction Oxygen Delivery Method: Circle system utilized Preoxygenation: Pre-oxygenation with 100% oxygen Induction Type: IV induction Ventilation: Mask ventilation without difficulty LMA: LMA inserted LMA Size: 4.0 Number of attempts: 1 Airway Equipment and Method: Bite block Placement Confirmation: positive ETCO2 Tube secured with: Tape Dental Injury: Teeth and Oropharynx as per pre-operative assessment

## 2019-03-04 NOTE — Discharge Instructions (Addendum)
[name], MD [facility]  Please read the following information regarding your care after surgery.  Medications  You only need a prescription for the narcotic pain medicine (ex. oxycodone, Percocet, Norco).  All of the other medicines listed below are available over the counter. X Aleve 2 pills twice a day for the first 3 days after surgery. X acetominophen (Tylenol) 650 mg every 4-6 hours as you need for minor to moderate pain X oxycodone as prescribed for severe pain  Narcotic pain medicine (ex. oxycodone, Percocet, Vicodin) will cause constipation.  To prevent this problem, take the following medicines while you are taking any pain medicine. X docusate sodium (Colace) 100 mg twice a day     X senna (Senokot) 2 tablets twice a day  X To help prevent blood clots, take a baby aspirin (81 mg) twice a day after surgery.  You should also get up every hour while you are awake to move around.    Weight Bearing X Do not bear any weight on the operated leg or foot.  Cast / Splint / Dressing X Keep your splint, cast or dressing clean and dry.  Dont put anything (coat hanger, pencil, etc) down inside of it.  If it gets damp, use a hair dryer on the cool setting to dry it.  If it gets soaked, call the office to schedule an appointment for a cast change.  After your dressing, cast or splint is removed; you may shower, but do not soak or scrub the wound.  Allow the water to run over it, and then gently pat it dry.  Swelling It is normal for you to have swelling where you had surgery.  To reduce swelling and pain, keep your toes above your nose for at least 3 days after surgery.  It may be necessary to keep your foot or leg elevated for several weeks.  If it hurts, it should be elevated.  Follow Up Call my office at [phone] when you are discharged from the hospital or surgery center to schedule an appointment to be seen two weeks after surgery.  Call my office at [phone] if you develop  a fever >101.5 F, nausea, vomiting, bleeding from the surgical site or severe pain.    Regional Anesthesia Blocks  1. Numbness or the inability to move the "blocked" extremity may last from 3-48 hours after placement. The length of time depends on the medication injected and your individual response to the medication. If the numbness is not going away after 48 hours, call your surgeon.  2. The extremity that is blocked will need to be protected until the numbness is gone and the  Strength has returned. Because you cannot feel it, you will need to take extra care to avoid injury. Because it may be weak, you may have difficulty moving it or using it. You may not know what position it is in without looking at it while the block is in effect.  3. For blocks in the legs and feet, returning to weight bearing and walking needs to be done carefully. You will need to wait until the numbness is entirely gone and the strength has returned. You should be able to move your leg and foot normally before you try and bear weight or walk. You will need someone to be with you when you first try to ensure you do not fall and possibly risk injury.  4. Bruising and tenderness at the needle site are common side effects and will resolve  in a few days.  5. Persistent numbness or new problems with movement should be communicated to the surgeon or the [facility] [phone] [place] [phone]).

## 2019-03-04 NOTE — Op Note (Signed)
03/04/2019  9:54 AM  PATIENT:  Julie Gregory  63 y.o. female  PRE-OPERATIVE DIAGNOSIS:  right ankle post traumatic arthritis  POST-OPERATIVE DIAGNOSIS:  same  Procedure(s): Right TOTAL ANKLE ARTHOPLASTY  SURGEON:  [name], MD  ASSISTANT: Mechele Claude, PA-C  ANESTHESIA:   General, regional  EBL:  minimal   TOURNIQUET:   Total Tourniquet Time Documented: Thigh (Right) - 112 minutes Total: Thigh (Right) - [id number] minutes  COMPLICATIONS:  Intra op fracture of the medial malleolus  DISPOSITION:  Extubated, awake and stable to recovery.  INDICATION FOR PROCEDURE: The patient is a 63 year old female with a long history of right ankle pain due to posttraumatic arthritis.  She has failed nonoperative treatment to date including activity modification, oral anti-inflammatories and shoewear modification as well as bracing and therapy.  She presents now for operative treatment of this painful and limiting condition.  The risks and benefits of the alternative treatment options have been discussed in detail.  The patient wishes to proceed with surgery and specifically understands risks of bleeding, infection, nerve damage, blood clots, need for additional surgery, amputation and death.  PROCEDURE IN DETAIL:  After pre operative consent was obtained, and the correct operative site was identified, the patient was brought to the operating room and placed supine on the OR table.  Anesthesia was administered.  Pre-operative antibiotics were administered.  A surgical timeout was taken.  The right lower extremity was prepped and draped in standard sterile fashion with a tourniquet around the thigh.  The extremity was elevated and the tourniquet was inflated to 250 mmHg.  An anterior incision was made over the extensor houses longus tendon.  Dissection was carried down through the subcutaneous tissues taking care to protect branches of the superficial peroneal nerve distally.  The extensor retinaculum was  incised over the EHL and released proximally and distally.  The interval between the tibialis anterior and EHL tendons was developed.  The neurovascular bundle was identified.  It was retracted laterally and protected throughout the case.  The anterior ankle joint capsule was incised and elevated medially and laterally.  Patient was noted to have some varus malalignment through the ankle joint with a tight deltoid ligament.  Subperiosteal dissection was then carried distal to the medial malleolus releasing the deep deltoid fibers.  The anterior osteophytes were resected with a rondure.  The tibial alignment guide was fixed to the proximal tibia with a guidepin in line with the medial gutter.  The alignment guide was then positioned over the tibial crest to the level of the medial corner of the mortise.  It was provisionally pinned.  AP and lateral radiographs confirmed appropriate alignment, and the proximal block was pinned.  Rotation was set and the proximal block secured.  AP and lateral radiographs confirmed varus valgus alignment and appropriate resection level.  The distal block was then pinned in line with the medial gutter.  The reciprocating saw was used to cut the medial gutter.  The oscillating saw was then used to cut the distal tibia.  Both guide pins were removed.  A curved osteotome was used to remove the distal cut bone.  At this point a fracture of the medial malleolus was identified.  A guidepin was inserted from the tip of the medial malleolus to the metaphyseal bone across the fracture site.  The guidepin was overdrilled and a 4 mm x 40 mm partially-threaded cannulated screw was inserted from the [employer] small frag set.  The screw was noted  to have excellent purchase and compressed the fracture site appropriately.  Attention was returned to the ankle joint.  The talar cutting guide was inserted and provisionally pinned after confirming appropriate alignment on the lateral fluoroscopic  view.  The talar cut was made.  The talus was sized as a #2.  The tibia was sized as a #2 as well.  The talar cutting block was inserted and pinned.  The chamfer cuts were made and the 2 anterior slots were milled.  The cut surfaces were smoothed with a rasp.  The trial tibia was inserted and was noted to fit appropriately.  the 2 lug holes were then drilled.  The trial tibia was inserted.  It was also noted to fit appropriately.  Rotation was set.  It was pinned and then further tightened.  The 3 peripheral lug holes were punched followed by the middle lug hole.  The trial was removed.  The wound was irrigated copiously.  Vancomycin powder was sprinkled on the base of the tibial implant.  It was inserted and impacted into position.  A lateral radiograph confirmed appropriate position of the tibial component.  The talar component was then sprinkled with vancomycin powder and inserted.  It was impacted.  Again a lateral radiograph confirmed appropriate position.  Trial poly was inserted.  The ankle was noted to have appropriate range of motion and medial and lateral ligament balance.  The wound was again irrigated copiously after removing the trial.  A 10 mm polyimplant was inserted followed by the locking clip.  Final AP, lateral and mortise radiographs confirmed appropriate position and length of all hardware and appropriate reduction and fixation of the medial malleolus fracture.  Vancomycin powder was sprinkled in the deep portion of the wound.  The anterior capsule was repaired with 0 Vicryl.  The extensor retinaculum was repaired with 0 Vicryl.  Subcutaneous tissues were then approximated with 3-0 Monocryl.  The skin incision was closed with running 3-0 nylon.  Sterile dressings were applied followed by a well-padded short leg cast.  The tourniquet was released after application of the dressings.  The patient was awakened from anesthesia and transported to the recovery room in stable condition.  FOLLOW UP  PLAN: The patient will be observed overnight for pain control.  She will be nonweightbearing on the right lower extremity in a short leg cast for a total of 6 weeks postop.  Follow-up with me in the office in 3 weeks for suture removal.  Aspirin for DVT prophylaxis.     Mechele Claude PA-C was present and scrubbed for the duration of the operative case. His assistance was essential in positioning the patient, prepping and draping, gaining and maintaining exposure, performing the operation, closing and dressing the wounds and applying the splint.

## 2019-03-04 NOTE — Transfer of Care (Signed)
Immediate Anesthesia Transfer of Care Note  Patient: Julie Gregory  Procedure(s) Performed: TOTAL ANKLE ARTHOPLASTY (Right Ankle)  Patient Location: PACU  Anesthesia Type:GA combined with regional for post-op pain  Level of Consciousness: drowsy and patient cooperative  Airway & Oxygen Therapy: Patient Spontanous Breathing and Patient connected to face mask oxygen  Post-op Assessment: Report given to RN and Post -op Vital signs reviewed and stable  Post vital signs: Reviewed and stable  Last Vitals:  Vitals Value Taken Time  BP    Temp    Pulse 80 03/04/19 0952  Resp    SpO2 97 % 03/04/19 0952    Last Pain:  Vitals:   03/04/19 0638  TempSrc: Oral  PainSc: 0-No pain         Complications: No apparent anesthesia complications

## 2019-03-05 NOTE — Discharge Summary (Signed)
Physician Discharge Summary  Patient ID: Julie Gregory MRN: [id number] DOB/AGE: [birth year] 63 y.o.  Admit date: 03/04/2019 Discharge date: 03/05/2019  Admission Diagnoses: post traumatic ankle arthritis; hx of osteoporosis, depression, ADHD.  Discharge Diagnoses:  Active Problems:   * No active hospital problems. * Same as above  Discharged Condition: stable  Hospital Course: Patient presented to [facility] for elective right total ankle replacement by Dr. [name] on March 04, 2019.  The patient tolerated the procedure well without any complications.  She stayed overnight for pain control and observation.  She tolerated her stay well without any complications.  She is discharged home on March 05, 2019.  Consults: N/A  Significant Diagnostic Studies: radiology: X-Ray: to ensure satisfactory anatomic alignment during operative procedure.  Treatments: IV hydration, antibiotics: Ancef, analgesia: acetaminophen, Vicodin and Dilaudid and surgery: as stated above.  Discharge Exam: Blood pressure 109/63, pulse 83, temperature 98.3 F (36.8 C), resp. rate 18, height 5\' 3"  (1.6 m), weight 88.6 kg, SpO2 97 %.   Disposition: Discharge disposition: 01-Home or Self Care       Discharge Instructions    Call MD / Call 911   Complete by: As directed    If you experience chest pain or shortness of breath, CALL 911 and be transported to the hospital emergency room.  If you develope a fever above 101 F, pus (white drainage) or increased drainage or redness at the wound, or calf pain, call your surgeon's office.   Constipation Prevention   Complete by: As directed    Drink plenty of fluids.  Prune juice may be helpful.  You may use a stool softener, such as Colace (over the counter) 100 mg twice a day.  Use MiraLax (over the counter) for constipation as needed.   Diet - low sodium heart healthy   Complete by: As directed    Increase activity slowly as  tolerated   Complete by: As directed    Non weight bearing   Complete by: As directed    Laterality: right   Extremity: Lower     Allergies as of 03/05/2019   No Known Allergies     Medication List    TAKE these medications   aspirin EC 81 MG tablet Take 1 tablet (81 mg total) by mouth 2 (two) times daily.   b complex vitamins capsule Take 1 capsule by mouth daily.   calcium-vitamin D 500-200 MG-UNIT tablet Commonly known as: OSCAL WITH D Take 1 tablet by mouth.   docusate sodium 100 MG capsule Commonly known as: Colace Take 1 capsule (100 mg total) by mouth 2 (two) times daily. While taking narcotic pain medicine.   loratadine 10 MG tablet Commonly known as: CLARITIN Take 10 mg by mouth daily.   Magnesium 400 MG Tabs Take by mouth.   MULTIVITAMIN ADULT PO Take by mouth.   oxyCODONE 5 MG immediate release tablet Commonly known as: Roxicodone Take 1 tablet (5 mg total) by mouth every 4 (four) hours as needed for up to 5 days.   Potassium Aminobenzoate 500 MG Caps Take by mouth.   PROBIOTIC ACIDOPHILUS PO Take by mouth.   senna 8.6 MG Tabs tablet Commonly known as: SENOKOT Take 2 tablets (17.2 mg total) by mouth 2 (two) times daily.   Turmeric 500 MG Caps Take by mouth.   WELLBUTRIN PO Take by mouth.   Zinc 50 MG Caps Take by mouth.  Discharge Care Instructions  (From admission, onward)         Start     Ordered   03/04/19 0000  Non weight bearing    Question Answer Comment  Laterality right   Extremity Lower      03/04/19 0957         Follow-up Information    [name], MD. Schedule an appointment as soon as possible for a visit in 3 weeks.   Specialty: Orthopedic Surgery Contact information: [address] [place] [address] [phone]           Signed: Mohammed Kindle Office:  [phone]

## 2019-03-11 ENCOUNTER — Encounter (HOSPITAL_BASED_OUTPATIENT_CLINIC_OR_DEPARTMENT_OTHER): Payer: Self-pay | Admitting: Orthopedic Surgery

## 2019-04-07 ENCOUNTER — Other Ambulatory Visit: Payer: Self-pay | Admitting: Obstetrics & Gynecology

## 2019-04-07 DIAGNOSIS — N6459 Other signs and symptoms in breast: Secondary | ICD-10-CM

## 2019-05-12 ENCOUNTER — Ambulatory Visit
Admission: RE | Admit: 2019-05-12 | Discharge: 2019-05-12 | Disposition: A | Discharge status: Home / Self Care | Payer: 59 | Source: Ambulatory Visit | Attending: Obstetrics & Gynecology | Admitting: Obstetrics & Gynecology

## 2019-05-12 ENCOUNTER — Other Ambulatory Visit: Payer: Self-pay

## 2019-05-12 DIAGNOSIS — N6459 Other signs and symptoms in breast: Secondary | ICD-10-CM

## 2020-05-12 IMAGING — MG DIGITAL DIAGNOSTIC BILAT W/ TOMO W/ CAD
6 of 10 series · 6 of 30 positions shown · non-contrast
Comparison: Previous exam(s).

ACR Breast Density Category a: The breast tissue is almost entirely
fatty.

CLINICAL DATA: 63-year-old female presenting for annual exam as
well as for a palpable area of concern felt by her doctor in the
inferior right breast.

EXAM:
DIGITAL DIAGNOSTIC BILATERAL MAMMOGRAM WITH CAD AND TOMO
ULTRASOUND RIGHT BREAST

[L MLO synth-2D]
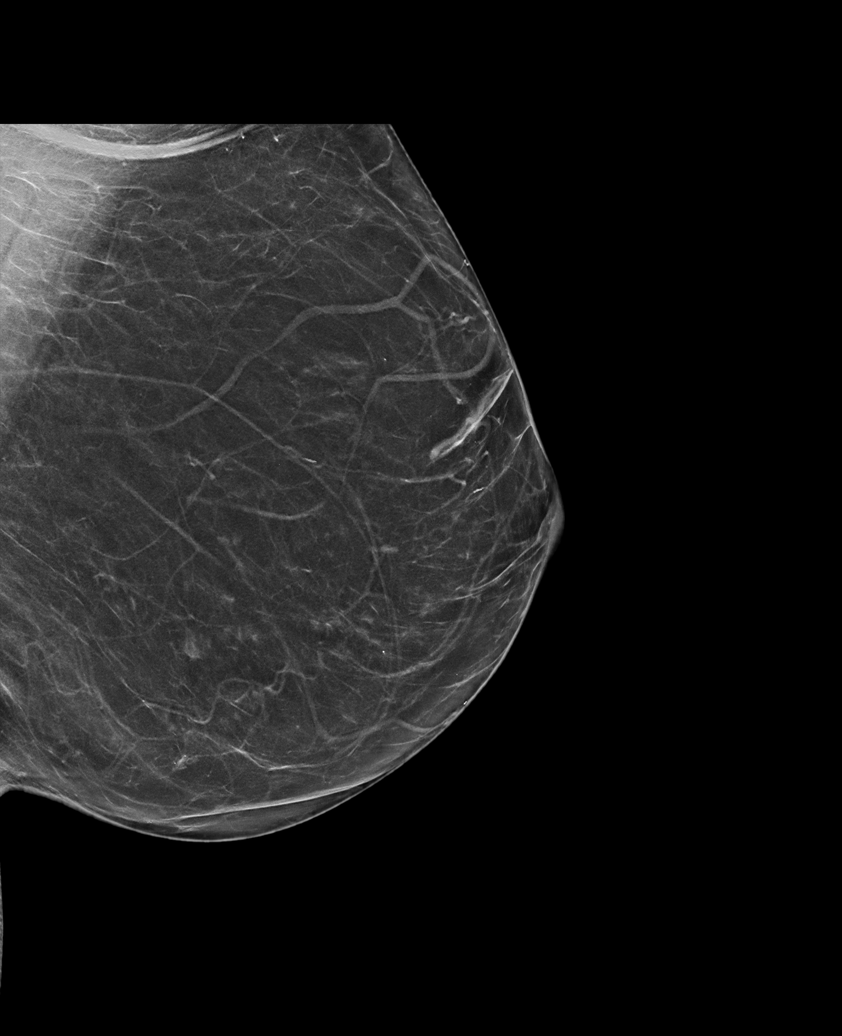

[R CC synth-2D]
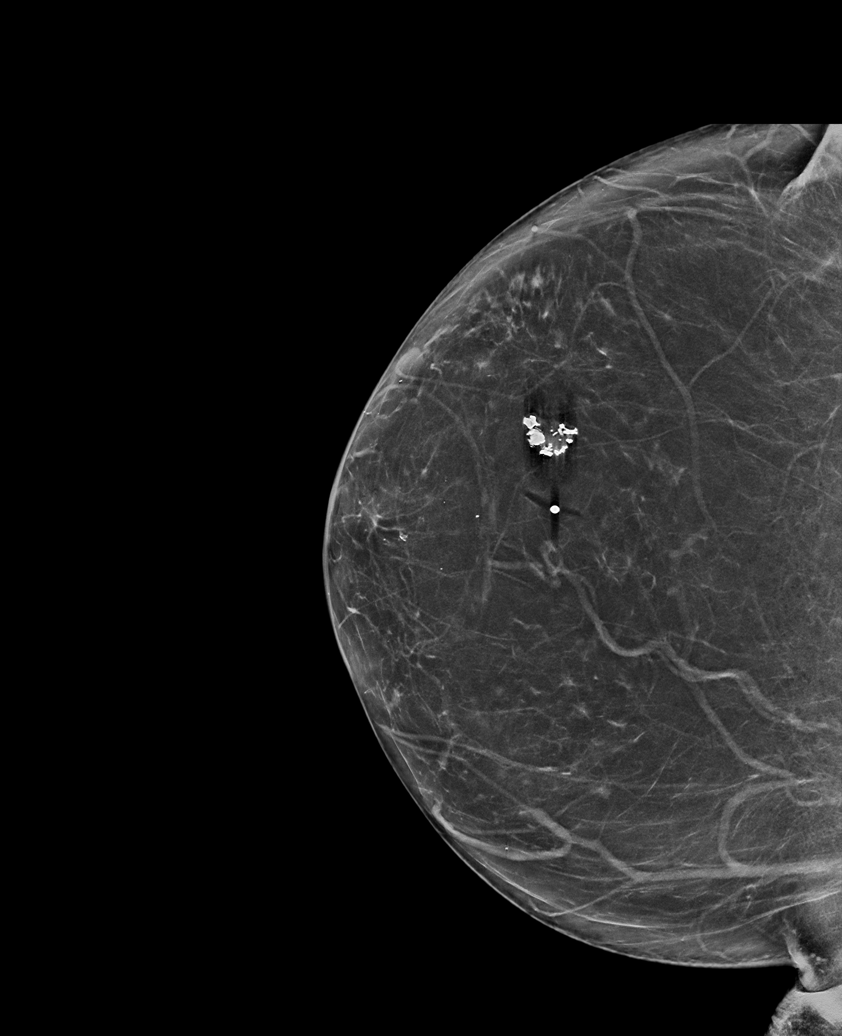

[L CC synth-2D]
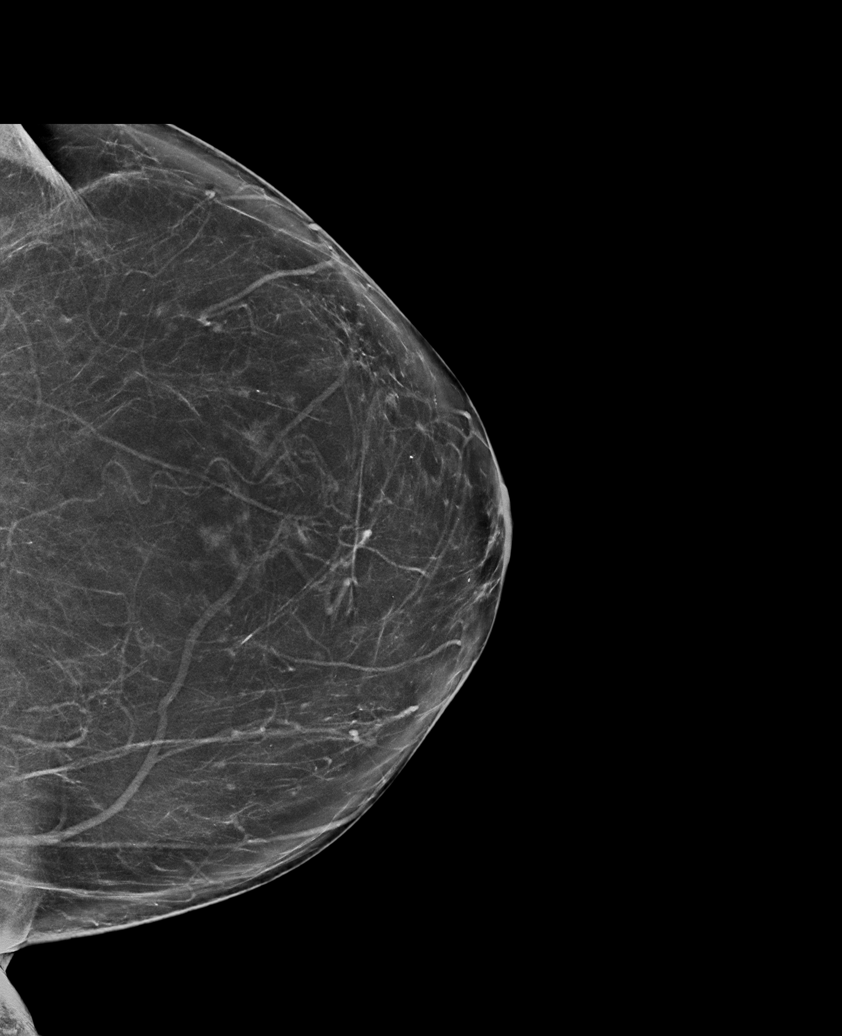

[R ML synth-2D]
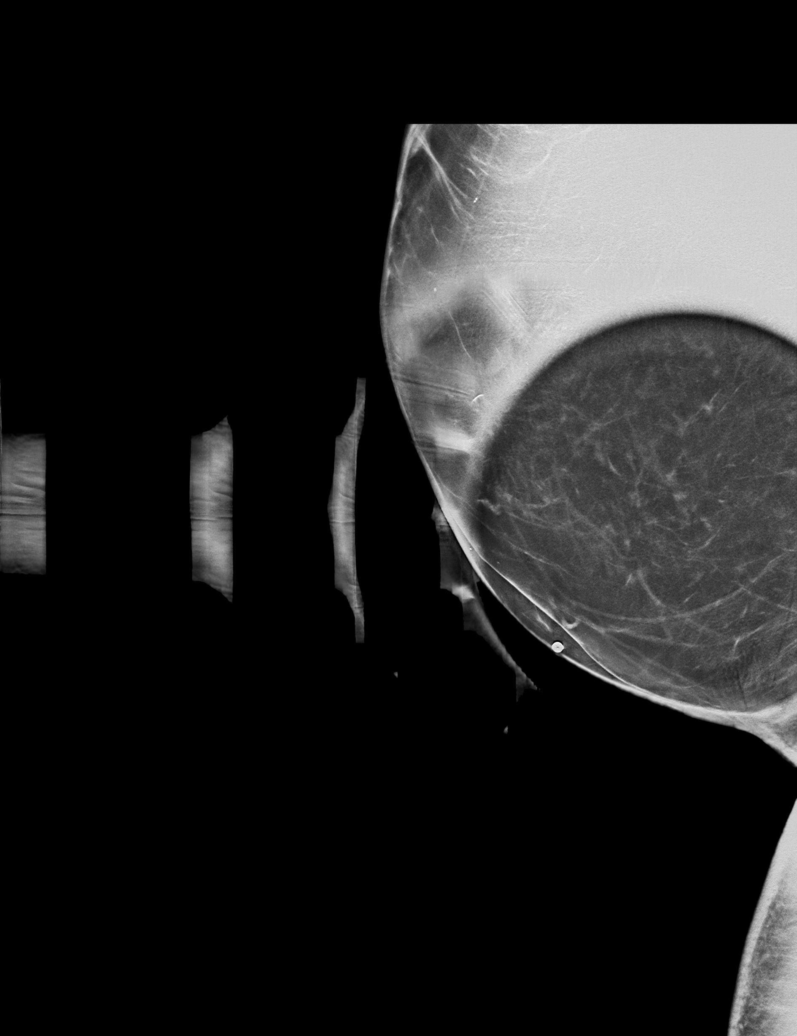

[R MLO synth-2D]
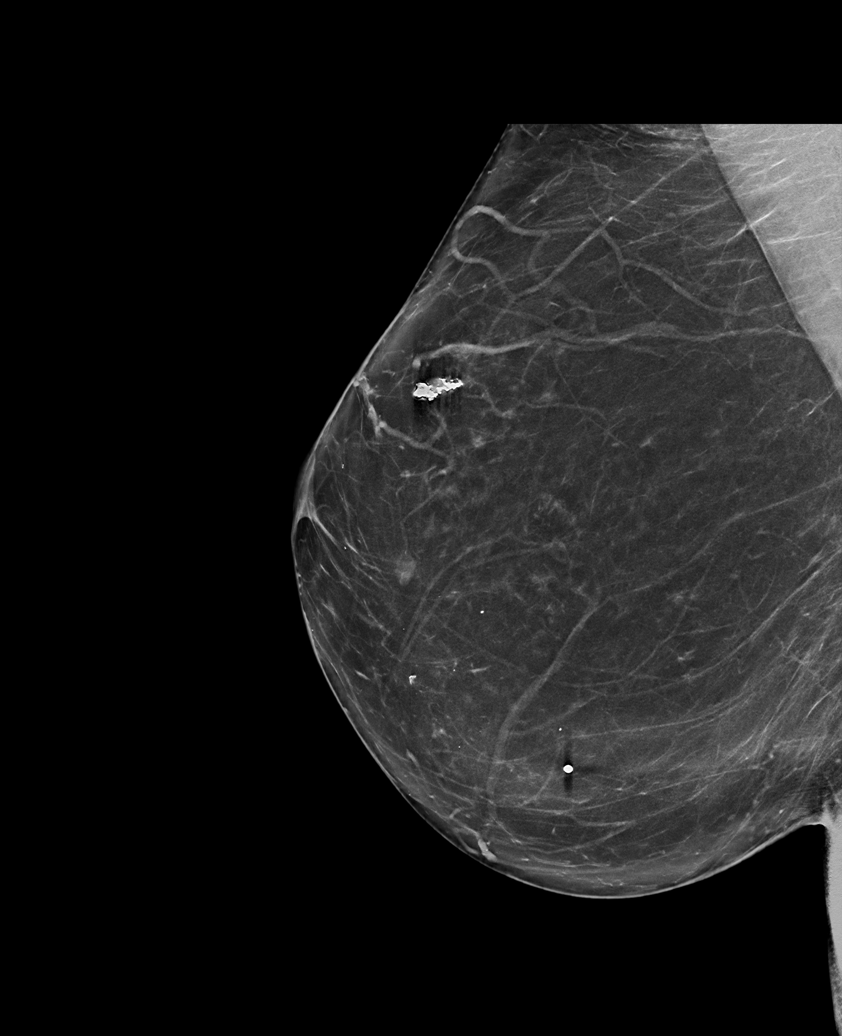

[R ML tomo · tomo slice 33/66.0]
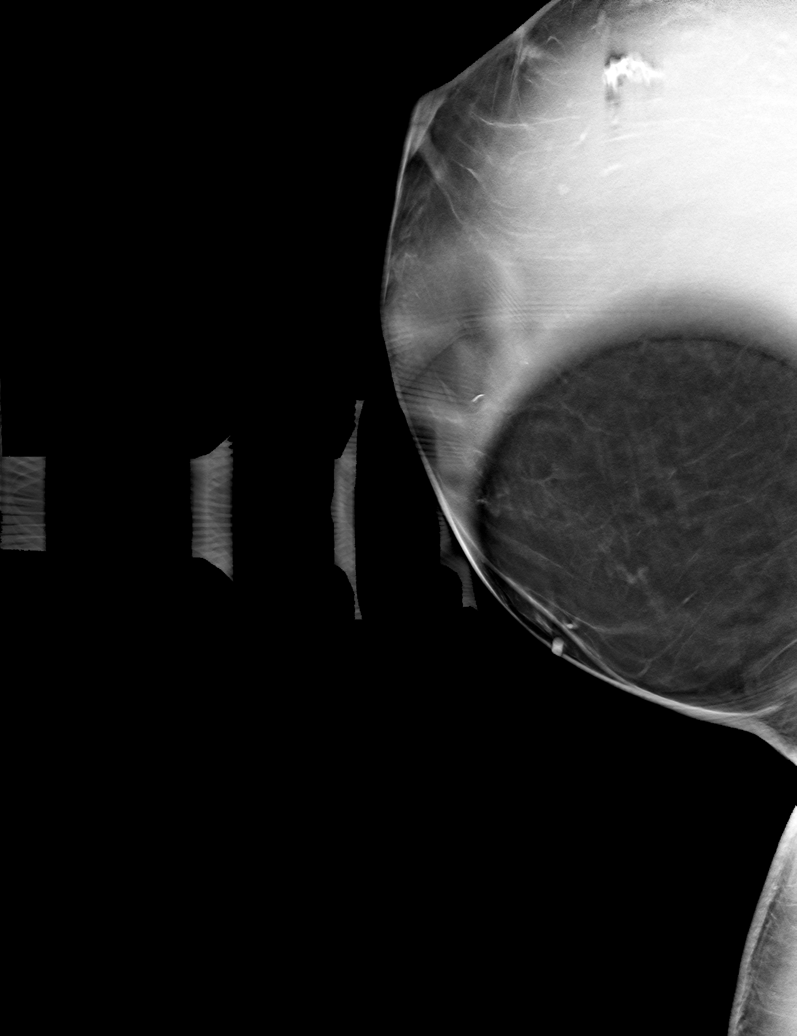

[6 of 30 positions shown; findings below may reference images not displayed]

FINDINGS: Mammogram:

Right breast: At the palpable site of concern in the inferior right
breast there is no abnormality identified on full field or spot
compression imaging. No suspicious mass, distortion, or
microcalcifications are identified to suggest presence of malignancy
in the right breast.

Left breast: No suspicious mass, distortion, or microcalcifications
are identified to suggest presence of malignancy.

Mammographic images were processed with CAD.

On physical exam, I palpate some mild thickening along the patient's
scar in the inferior right breast from prior reduction mammoplasty.
There is no fixed discrete mass.

Ultrasound:

Targeted ultrasound is performed in the inferior right breast
demonstrating no suspicious cystic or solid mass. No dilated duct or
focal fluid collection.
IMPRESSION: No mammographic or sonographic evidence of malignancy at the
palpable site of concern in the right breast. No mammographic
evidence of malignancy in the left breast.

RECOMMENDATION:
Screening mammogram in one year.(Code:[id number])

I have discussed the findings and recommendations with the patient.
If applicable, a reminder letter will be sent to the patient
regarding the next appointment.

BI-RADS CATEGORY  1: Negative.

## 2022-12-10 ENCOUNTER — Other Ambulatory Visit: Payer: Self-pay | Admitting: Internal Medicine

## 2022-12-10 DIAGNOSIS — Z1231 Encounter for screening mammogram for malignant neoplasm of breast: Secondary | ICD-10-CM

## 2023-02-26 ENCOUNTER — Ambulatory Visit
Admission: RE | Admit: 2023-02-26 | Discharge: 2023-02-26 | Disposition: A | Discharge status: Home / Self Care | Payer: Medicare HMO | Source: Ambulatory Visit | Attending: Internal Medicine | Admitting: Internal Medicine

## 2023-02-26 ENCOUNTER — Ambulatory Visit: Payer: 59

## 2023-02-26 DIAGNOSIS — Z1231 Encounter for screening mammogram for malignant neoplasm of breast: Secondary | ICD-10-CM

## 2023-05-05 DIAGNOSIS — Z124 Encounter for screening for malignant neoplasm of cervix: Secondary | ICD-10-CM | POA: Diagnosis not present

## 2023-05-05 DIAGNOSIS — Z6839 Body mass index (BMI) 39.0-39.9, adult: Secondary | ICD-10-CM | POA: Diagnosis not present

## 2023-05-05 DIAGNOSIS — R251 Tremor, unspecified: Secondary | ICD-10-CM | POA: Diagnosis not present

## 2023-05-05 DIAGNOSIS — F339 Major depressive disorder, recurrent, unspecified: Secondary | ICD-10-CM | POA: Diagnosis not present

## 2023-05-05 DIAGNOSIS — Z0000 Encounter for general adult medical examination without abnormal findings: Secondary | ICD-10-CM | POA: Diagnosis not present

## 2023-05-05 DIAGNOSIS — Z7185 Encounter for immunization safety counseling: Secondary | ICD-10-CM | POA: Diagnosis not present

## 2023-05-05 DIAGNOSIS — M5441 Lumbago with sciatica, right side: Secondary | ICD-10-CM | POA: Diagnosis not present

## 2023-05-05 DIAGNOSIS — Z1159 Encounter for screening for other viral diseases: Secondary | ICD-10-CM | POA: Diagnosis not present

## 2023-05-05 DIAGNOSIS — Z136 Encounter for screening for cardiovascular disorders: Secondary | ICD-10-CM | POA: Diagnosis not present

## 2023-05-05 DIAGNOSIS — E2839 Other primary ovarian failure: Secondary | ICD-10-CM | POA: Diagnosis not present

## 2023-05-05 DIAGNOSIS — R7303 Prediabetes: Secondary | ICD-10-CM | POA: Diagnosis not present

## 2023-05-05 DIAGNOSIS — Z13220 Encounter for screening for lipoid disorders: Secondary | ICD-10-CM | POA: Diagnosis not present

## 2023-05-05 DIAGNOSIS — R030 Elevated blood-pressure reading, without diagnosis of hypertension: Secondary | ICD-10-CM | POA: Diagnosis not present

## 2023-05-07 ENCOUNTER — Other Ambulatory Visit: Payer: Self-pay | Admitting: Family Medicine

## 2023-05-07 DIAGNOSIS — E2839 Other primary ovarian failure: Secondary | ICD-10-CM

## 2023-05-18 ENCOUNTER — Emergency Department (HOSPITAL_BASED_OUTPATIENT_CLINIC_OR_DEPARTMENT_OTHER)
Admission: EM | Admit: 2023-05-18 | Discharge: 2023-05-18 | Disposition: A | Discharge status: Home / Self Care | Payer: No Typology Code available for payment source | Attending: Emergency Medicine | Admitting: Emergency Medicine

## 2023-05-18 ENCOUNTER — Other Ambulatory Visit: Payer: Self-pay

## 2023-05-18 ENCOUNTER — Encounter (HOSPITAL_BASED_OUTPATIENT_CLINIC_OR_DEPARTMENT_OTHER): Payer: Self-pay | Admitting: Emergency Medicine

## 2023-05-18 ENCOUNTER — Emergency Department (HOSPITAL_BASED_OUTPATIENT_CLINIC_OR_DEPARTMENT_OTHER): Payer: No Typology Code available for payment source

## 2023-05-18 DIAGNOSIS — N201 Calculus of ureter: Secondary | ICD-10-CM | POA: Diagnosis not present

## 2023-05-18 DIAGNOSIS — N132 Hydronephrosis with renal and ureteral calculous obstruction: Secondary | ICD-10-CM | POA: Insufficient documentation

## 2023-05-18 DIAGNOSIS — K5730 Diverticulosis of large intestine without perforation or abscess without bleeding: Secondary | ICD-10-CM | POA: Diagnosis not present

## 2023-05-18 DIAGNOSIS — Z7982 Long term (current) use of aspirin: Secondary | ICD-10-CM | POA: Insufficient documentation

## 2023-05-18 DIAGNOSIS — R102 Pelvic and perineal pain: Secondary | ICD-10-CM | POA: Diagnosis present

## 2023-05-18 DIAGNOSIS — K8689 Other specified diseases of pancreas: Secondary | ICD-10-CM | POA: Diagnosis not present

## 2023-05-18 LAB — COMPREHENSIVE METABOLIC PANEL
ALT: 29 U/L (ref 0–44)
AST: 33 U/L (ref 15–41)
Albumin: 4.1 g/dL (ref 3.5–5.0)
Alkaline Phosphatase: 66 U/L (ref 38–126)
Anion gap: 12 (ref 5–15)
BUN: 17 mg/dL (ref 8–23)
CO2: 22 mmol/L (ref 22–32)
Calcium: 9.5 mg/dL (ref 8.9–10.3)
Chloride: 103 mmol/L (ref 98–111)
Creatinine, Ser: 0.78 mg/dL (ref 0.44–1.00)
GFR, Estimated: >60 mL/min (ref >60)
Glucose, Bld: 152 mg/dL — ABNORMAL HIGH (ref 70–99)
Potassium: 3.6 mmol/L (ref 3.5–5.1)
Sodium: 137 mmol/L (ref 135–145)
Total Bilirubin: 0.7 mg/dL (ref 0.0–1.2)
Total Protein: 7.1 g/dL (ref 6.5–8.1)

## 2023-05-18 LAB — CBC WITH DIFFERENTIAL/PLATELET
Abs Immature Granulocytes: 0.03 10*3/uL (ref 0.00–0.07)
Basophils Absolute: 0 10*3/uL (ref 0.0–0.1)
Basophils Relative: 0 %
Eosinophils Absolute: 0.1 10*3/uL (ref 0.0–0.5)
Eosinophils Relative: 1 %
HCT: 40.8 % (ref 36.0–46.0)
Hemoglobin: 13.7 g/dL (ref 12.0–15.0)
Immature Granulocytes: 0 %
Lymphocytes Relative: 14 %
Lymphs Abs: 1.6 10*3/uL (ref 0.7–4.0)
MCH: 31.8 pg (ref 26.0–34.0)
MCHC: 33.6 g/dL (ref 30.0–36.0)
MCV: 94.7 fL (ref 80.0–100.0)
Monocytes Absolute: 0.7 10*3/uL (ref 0.1–1.0)
Monocytes Relative: 6 %
Neutro Abs: 9.5 10*3/uL — ABNORMAL HIGH (ref 1.7–7.7)
Neutrophils Relative %: 79 %
Platelets: 246 10*3/uL (ref 150–400)
RBC: 4.31 MIL/uL (ref 3.87–5.11)
RDW: 12.3 % (ref 11.5–15.5)
WBC: 11.9 10*3/uL — ABNORMAL HIGH (ref 4.0–10.5)
nRBC: 0 % (ref 0.0–0.2)

## 2023-05-18 LAB — URINALYSIS, W/ REFLEX TO CULTURE (INFECTION SUSPECTED)
Bilirubin Urine: NEGATIVE
Glucose, UA: NEGATIVE mg/dL
Ketones, ur: 15 mg/dL — AB
Nitrite: NEGATIVE
Protein, ur: NEGATIVE mg/dL
Specific Gravity, Urine: 1.01 (ref 1.005–1.030)
pH: 7 (ref 5.0–8.0)

## 2023-05-18 MED ORDER — TAMSULOSIN HCL 0.4 MG PO CAPS
0.4000 mg | ORAL_CAPSULE | Freq: Once | ORAL | Status: AC
  Administered 2023-05-18: 0.4 mg via ORAL
  Filled 2023-05-18: qty 1

## 2023-05-18 MED ORDER — TAMSULOSIN HCL 0.4 MG PO CAPS: 0.4000 mg | ORAL_CAPSULE | Freq: Every day | ORAL | 0 refills | Status: AC

## 2023-05-18 NOTE — Discharge Instructions (Signed)
You may take the oxycodone for breakthrough pain.  You can take the Tylenol as you have already been taking it and add an NSAID such as ibuprofen, Advil, or Aleve.  Do not take all 3.  We are also adding tamsulosin/Flomax to help them the chances of you passing the stone.  You need to follow-up urgently with urology, give their office a call tomorrow to be seen in the next 1-2 days.  If you develop new or worsening or uncontrolled pain, fever, vomiting, or any other new/concerning symptoms then return to the ER or call 911.

## 2023-05-18 NOTE — ED Provider Notes (Signed)
[facility] Provider Note   CSN: [id number] Arrival date & time: 05/18/23  1914     History  Chief Complaint  Patient presents with   Flank Pain    Julie Gregory is a 68 y.o. female.  HPI 68 year old female presents with concern for a kidney stone.  Around 3 PM she developed right-sided back pain.  Does not radiate.  She has been having atraumatic pelvic pain for about 3 weeks but that is unchanged.  She also has been having difficulty urinating and after drinking a couple glasses of water she was only able to urinate a small amount here.  It has been burning a little to urinate.  No fevers or vomiting.  She was sweating due to the amount of pain she was in but then took 10 mg oxycodone and now her pain is essentially gone.  Home Medications Prior to Admission medications   Medication Sig Start Date End Date Taking? Authorizing Provider  tamsulosin (FLOMAX) 0.4 MG CAPS capsule Take 1 capsule (0.4 mg total) by mouth daily after supper. 05/18/23  Yes Pricilla Loveless, MD  aspirin EC 81 MG tablet Take 1 tablet (81 mg total) by mouth 2 (two) times daily. 03/04/19   Jacinta Shoe, PA-C  b complex vitamins capsule Take 1 capsule by mouth daily.    Alver Fisher, RN  BuPROPion HCl (WELLBUTRIN PO) Take by mouth.    [provider]  calcium-vitamin D (OSCAL WITH D) 500-200 MG-UNIT tablet Take 1 tablet by mouth.    Alver Fisher, RN  docusate sodium (COLACE) 100 MG capsule Take 1 capsule (100 mg total) by mouth 2 (two) times daily. While taking narcotic pain medicine. 03/04/19   Jacinta Shoe, PA-C  Lactobacillus (PROBIOTIC ACIDOPHILUS PO) Take by mouth.    Alver Fisher, RN  loratadine (CLARITIN) 10 MG tablet Take 10 mg by mouth daily.    [provider]  Magnesium 400 MG TABS Take by mouth.    Alver Fisher, RN  Multiple Vitamins-Minerals (MULTIVITAMIN ADULT PO) Take by mouth.    [provider]   Potassium Aminobenzoate 500 MG CAPS Take by mouth.    Alver Fisher, RN  senna (SENOKOT) 8.6 MG TABS tablet Take 2 tablets (17.2 mg total) by mouth 2 (two) times daily. 03/04/19   Jacinta Shoe, PA-C  Turmeric 500 MG CAPS Take by mouth.    Alver Fisher, RN  Zinc 50 MG CAPS Take by mouth.    Alver Fisher, RN      Allergies    Patient has no known allergies.    Review of Systems   Review of Systems  Gastrointestinal:  Negative for abdominal pain and vomiting.  Genitourinary:  Positive for dysuria and pelvic pain.  Musculoskeletal:  Positive for back pain.    Physical Exam Updated Vital Signs BP (!) 161/83 (BP Location: Left Arm)   Pulse 85   Temp (!) 97.4 F (36.3 C)   Resp 20   Ht 5\' 2"  (1.575 m)   Wt 94.8 kg   SpO2 95%   BMI 38.23 kg/m  Physical Exam Vitals and nursing note reviewed.  Constitutional:      Appearance: She is well-developed.  HENT:     Head: Normocephalic and atraumatic.  Cardiovascular:     Rate and Rhythm: Normal rate and regular rhythm.     Heart sounds: Normal heart sounds.  Pulmonary:  Effort: Pulmonary effort is normal.     Breath sounds: Normal breath sounds.  Abdominal:     Palpations: Abdomen is soft.     Tenderness: There is abdominal tenderness (mild) in the suprapubic area. There is right CVA tenderness.     Comments: No rash  Skin:    General: Skin is warm and dry.  Neurological:     Mental Status: She is alert.     ED Results / Procedures / Treatments   Labs (all labs ordered are listed, but only abnormal results are displayed) Labs Reviewed  URINALYSIS, W/ REFLEX TO CULTURE (INFECTION SUSPECTED) - Abnormal; Notable for the following components:      Result Value   Hgb urine dipstick TRACE (*)    Ketones, ur 15 (*)    Leukocytes,Ua TRACE (*)    Bacteria, UA RARE (*)    All other components within normal limits  COMPREHENSIVE METABOLIC PANEL - Abnormal; Notable for the following components:   Glucose, Bld 152  (*)    All other components within normal limits  CBC WITH DIFFERENTIAL/PLATELET - Abnormal; Notable for the following components:   WBC 11.9 (*)    Neutro Abs 9.5 (*)    All other components within normal limits  URINE CULTURE    EKG None  Radiology CT Renal Stone Study Result Date: 05/18/2023 CLINICAL DATA:  Right flank pain. EXAM: CT ABDOMEN AND PELVIS WITHOUT CONTRAST TECHNIQUE: Multidetector CT imaging of the abdomen and pelvis was performed following the standard protocol without IV contrast. RADIATION DOSE REDUCTION: This exam was performed according to the departmental dose-optimization program which includes automated exposure control, adjustment of the mA and/or kV according to patient size and/or use of iterative reconstruction technique. COMPARISON:  CT 01/22/2016 FINDINGS: Lower chest: No acute findings. Hepatobiliary: No focal liver abnormality is seen. Mild diffuse hepatic steatosis. Status post cholecystectomy. No biliary dilatation. Pancreas: Fatty atrophy of the pancreatic head. No ductal dilatation or inflammation. Spleen: Normal in size without focal abnormality. Adrenals/Urinary Tract: Normal adrenal glands. Obstructing 8 x 10 mm stone at the right ureterovesicular junction with moderate hydroureteronephrosis. Mild right perinephric edema. Nonobstructing stone in the right lower pole measures 5 mm. Punctate nonobstructing stone in the lower pole of the left kidney. No left hydronephrosis. Urinary bladder is minimally distended. Stomach/Bowel: The stomach is nondistended. There is no bowel obstruction or inflammation. The appendix is normal. Small volume of stool in the colon. Sigmoid colonic diverticulosis without diverticulitis. Vascular/Lymphatic: Normal caliber abdominal aorta. No enlarged lymph nodes in the abdomen or pelvis. Reproductive: Uterus and bilateral adnexa are unremarkable. Other: Pelvic phleboliths. No ascites. No inguinal or abdominal wall hernia. Musculoskeletal:  Chronic bilateral L5 pars interarticularis defects with anterolisthesis of L5 on S1, degenerative disc disease and facet hypertrophy at this level. There is trace retrolisthesis of L3 on L4. IMPRESSION: 1. Obstructing 8 x 10 mm stone at the right ureterovesicular junction with moderate hydroureteronephrosis. 2. Additional nonobstructing bilateral renal stones. 3. Hepatic steatosis. 4. Sigmoid colonic diverticulosis without diverticulitis. 5. Chronic bilateral L5 pars interarticularis defects with anterolisthesis of L5 on S1, degenerative disc disease and facet hypertrophy at this level. Electronically Signed   By: Narda Rutherford M.D.   On: 05/18/2023 20:43    Procedures Procedures    Medications Ordered in ED Medications  tamsulosin (FLOMAX) capsule 0.4 mg (0.4 mg Oral Given 05/18/23 2135)    ED Course/ Medical Decision Making/ A&P  Medical Decision Making Amount and/or Complexity of Data Reviewed Labs: ordered.    Details: Blood in the urine, UA not consistent with UTI.  Slight leukocytosis of 11.9 Radiology: ordered and independent interpretation performed.    Details: Right ureteral stone  Risk Prescription drug management.   Patient's CT confirms a right ureteral stone.  It is quite large though at the UVJ.  Urine is not consistent with UTI, does have a slight leukocytosis but is quite well-appearing and has required no pain medicine here.  Will send urine for culture but I do not think this is a UTI.  I do not think this is an infected stone.  Will have her follow-up urgently with urology and prescribed Flomax.  Will recommend NSAIDs in addition to her home oxycodone.  Given return precautions.        Final Clinical Impression(s) / ED Diagnoses Final diagnoses:  Right ureteral stone    Rx / DC Orders ED Discharge Orders          Ordered    tamsulosin (FLOMAX) 0.4 MG CAPS capsule  Daily after supper        05/18/23 2131               Pricilla Loveless, MD 05/18/23 2332

## 2023-05-18 NOTE — ED Triage Notes (Signed)
Pt c/o RT flank pain since about 3pm; also reports "groin" pain which she has had for a while; took 10mg  oxycodone at home

## 2023-05-20 LAB — URINE CULTURE: Culture: NO GROWTH

## 2023-05-22 DIAGNOSIS — N201 Calculus of ureter: Secondary | ICD-10-CM | POA: Diagnosis not present

## 2023-05-23 DIAGNOSIS — N202 Calculus of kidney with calculus of ureter: Secondary | ICD-10-CM | POA: Diagnosis not present

## 2023-05-30 DIAGNOSIS — N201 Calculus of ureter: Secondary | ICD-10-CM | POA: Diagnosis not present

## 2023-06-04 DIAGNOSIS — N201 Calculus of ureter: Secondary | ICD-10-CM | POA: Diagnosis not present

## 2023-06-07 DIAGNOSIS — E119 Type 2 diabetes mellitus without complications: Secondary | ICD-10-CM | POA: Diagnosis not present

## 2023-06-07 DIAGNOSIS — F32A Depression, unspecified: Secondary | ICD-10-CM | POA: Diagnosis not present

## 2023-06-07 DIAGNOSIS — Z008 Encounter for other general examination: Secondary | ICD-10-CM | POA: Diagnosis not present

## 2023-06-07 DIAGNOSIS — Z6837 Body mass index (BMI) 37.0-37.9, adult: Secondary | ICD-10-CM | POA: Diagnosis not present

## 2023-06-07 DIAGNOSIS — M545 Low back pain, unspecified: Secondary | ICD-10-CM | POA: Diagnosis not present

## 2023-08-07 DIAGNOSIS — E1165 Type 2 diabetes mellitus with hyperglycemia: Secondary | ICD-10-CM | POA: Diagnosis not present

## 2023-08-12 ENCOUNTER — Ambulatory Visit (INDEPENDENT_AMBULATORY_CARE_PROVIDER_SITE_OTHER): Payer: No Typology Code available for payment source | Admitting: Neurology

## 2023-08-12 ENCOUNTER — Encounter: Payer: Self-pay | Admitting: Neurology

## 2023-08-12 VITALS — BP 135/79 | HR 54 | Ht 62.0 in | Wt 200.0 lb

## 2023-08-12 DIAGNOSIS — E1165 Type 2 diabetes mellitus with hyperglycemia: Secondary | ICD-10-CM | POA: Diagnosis not present

## 2023-08-12 DIAGNOSIS — G250 Essential tremor: Secondary | ICD-10-CM | POA: Diagnosis not present

## 2023-08-12 DIAGNOSIS — F339 Major depressive disorder, recurrent, unspecified: Secondary | ICD-10-CM | POA: Diagnosis not present

## 2023-08-12 DIAGNOSIS — E7800 Pure hypercholesterolemia, unspecified: Secondary | ICD-10-CM | POA: Diagnosis not present

## 2023-08-12 NOTE — Progress Notes (Signed)
 Chief Complaint  Patient presents with   New Patient (Initial Visit)    Rm15, alone,  referral for tremor / Dorena Gander MD [facility] [phone]:bilateral hand/arm ongoing for 1 yr or so, pt stated that she is here to also discuss skull change divots in skull (ongoing 1 yr or so as well)      ASSESSMENT AND PLAN  CAYLIN NASS is a 68 y.o. female   Essential tremor  No limitation on her daily function, family history of such,  Thyroid  functional test  She does not want to take any medication for it      DIAGNOSTIC DATA (LABS, IMAGING, TESTING) - I reviewed patient records, labs, notes, testing and imaging myself where available.   MEDICAL HISTORY:  FAYRENE TOWNER, is a 68 year old female seen in request by her primary care doctor   Dorena Gander, for evaluation of tremor, initial evaluation was August 12, 2023  History is obtained from the patient and review of electronic medical records. I personally reviewed pertinent available imaging films in PACS.   PMHx of  Depression  She retired, Patent examiner, crocheting, and sewing, denies difficulty doing any of those, but noticed intermittent bilateral hands tremor, also noticed hand shaking while driving,  She denies difficulty walking, her maternal grandfather has tremor,  In addition, she noticed frontal scalp change, she used to work as a Interior and spatial designer, noticed deep groove along bilateral frontal region, but no headaches,  PHYSICAL EXAM:   Vitals:   08/12/23 1043 08/12/23 1046  BP: (!) 146/94 135/79  Pulse: (!) 54   Weight: 200 lb (90.7 kg)   Height: 5\' 2"  (1.575 m)     Body mass index is 36.58 kg/m.  PHYSICAL EXAMNIATION:  Gen: NAD, conversant, well nourised, well groomed                     Cardiovascular: Regular rate rhythm, no peripheral edema, warm, nontender. Eyes: Conjunctivae clear without exudates or hemorrhage Neck: Supple, no carotid bruits. Pulmonary: Clear to auscultation  bilaterally   NEUROLOGICAL EXAM:  MENTAL STATUS: Speech/cognition: Awake, alert, oriented to history taking and casual conversation CRANIAL NERVES: CN II: Visual fields are full to confrontation. Pupils are round equal and briskly reactive to light. CN III, IV, VI: extraocular movement are normal. No ptosis. CN V: Facial sensation is intact to light touch CN VII: Face is symmetric with normal eye closure  CN VIII: Hearing is normal to causal conversation. CN IX, X: Phonation is normal. CN XI: Head turning and shoulder shrug are intact  MOTOR: Normal muscle tone, bulk and strength, mild bilateral hand action tremor  REFLEXES: Reflexes are 1 and symmetric at the biceps, triceps, knees, and ankles. Plantar responses are flexor.  SENSORY: Intact to light touch, pinprick and vibratory sensation are intact in fingers and toes.  COORDINATION: There is no trunk or limb dysmetria noted.  GAIT/STANCE: Posture is normal. Gait is steady   REVIEW OF SYSTEMS:  Full 14 system review of systems performed and notable only for as above All other review of systems were negative.   ALLERGIES: No Known Allergies  HOME MEDICATIONS: Current Outpatient Medications  Medication Sig Dispense Refill   buPROPion (WELLBUTRIN XL) 300 MG 24 hr tablet Take 300 mg by mouth daily.     aspirin  EC 81 MG tablet Take 1 tablet (81 mg total) by mouth 2 (two) times daily. 84 tablet 0   b complex vitamins capsule Take 1 capsule by  mouth daily.     calcium-vitamin D (OSCAL WITH D) 500-200 MG-UNIT tablet Take 1 tablet by mouth.     docusate sodium  (COLACE) 100 MG capsule Take 1 capsule (100 mg total) by mouth 2 (two) times daily. While taking narcotic pain medicine. 30 capsule 0   Lactobacillus (PROBIOTIC ACIDOPHILUS PO) Take by mouth.     loratadine  (CLARITIN ) 10 MG tablet Take 10 mg by mouth daily.     Magnesium  250 MG CAPS Take 2 capsules by mouth daily.     Multiple Vitamins-Minerals (MULTIVITAMIN ADULT PO)  Take by mouth.     Potassium Aminobenzoate 500 MG CAPS Take by mouth.     senna (SENOKOT) 8.6 MG TABS tablet Take 2 tablets (17.2 mg total) by mouth 2 (two) times daily. 30 tablet 0   tamsulosin  (FLOMAX ) 0.4 MG CAPS capsule Take 1 capsule (0.4 mg total) by mouth daily after supper. 7 capsule 0   Turmeric 500 MG CAPS Take by mouth.     Zinc 50 MG CAPS Take by mouth.     No current facility-administered medications for this visit.    PAST MEDICAL HISTORY: Past Medical History:  Diagnosis Date   ADHD (attention deficit hyperactivity disorder)    Depression    Osteoporosis     PAST SURGICAL HISTORY: Past Surgical History:  Procedure Laterality Date   BREAST REDUCTION SURGERY     CHOLECYSTECTOMY     REDUCTION MAMMAPLASTY     TOTAL ANKLE ARTHROPLASTY Right 03/04/2019   Procedure: TOTAL ANKLE ARTHOPLASTY;  Surgeon: Amada Backer, MD;  Location: [facility];  Service: Orthopedics;  Laterality: Right;    TUBAL LIGATION      FAMILY HISTORY: Family History  Problem Relation Age of Onset   Breast cancer Sister 47   Colon cancer Neg Hx    BRCA 1/2 Neg Hx     SOCIAL HISTORY: Social History   Socioeconomic History   Marital status: Married    Spouse name: Not on file   Number of children: Not on file   Years of education: Not on file   Highest education level: Not on file  Occupational History   Not on file  Tobacco Use   Smoking status: Never   Smokeless tobacco: Never  Vaping Use   Vaping status: Not on file  Substance and Sexual Activity   Alcohol use: Yes    Alcohol/week: 0.0 standard drinks of alcohol    Comment: occasional   Drug use: No   Sexual activity: Not on file  Other Topics Concern   Not on file  Social History Narrative   Not on file   Social Drivers of Health   Financial Resource Strain: Not on file  Food Insecurity: Not on file  Transportation Needs: Not on file  Physical Activity: Not on file  Stress: Not on file  Social  Connections: Not on file  Intimate Partner Violence: Not on file      Phebe Brasil, M.D. Ph.D.  [facility] [address], [address] [place], [place] [address] Ph: [phone] Fax: [phone]  CC:  Dorena Gander, MD [phone] [address] [place],  [place] [address]  Arva Lathe, MD

## 2023-08-13 ENCOUNTER — Encounter: Payer: Self-pay | Admitting: Neurology

## 2023-08-13 LAB — TSH: TSH: 1.18 u[IU]/mL (ref 0.450–4.500)

## 2023-08-25 DIAGNOSIS — M5136 Other intervertebral disc degeneration, lumbar region with discogenic back pain only: Secondary | ICD-10-CM | POA: Diagnosis not present

## 2023-08-25 DIAGNOSIS — Z6837 Body mass index (BMI) 37.0-37.9, adult: Secondary | ICD-10-CM | POA: Diagnosis not present

## 2023-09-10 DIAGNOSIS — M4186 Other forms of scoliosis, lumbar region: Secondary | ICD-10-CM | POA: Diagnosis not present

## 2023-09-10 DIAGNOSIS — M5136 Other intervertebral disc degeneration, lumbar region with discogenic back pain only: Secondary | ICD-10-CM | POA: Diagnosis not present

## 2023-09-10 DIAGNOSIS — M4317 Spondylolisthesis, lumbosacral region: Secondary | ICD-10-CM | POA: Diagnosis not present

## 2023-09-10 DIAGNOSIS — M5126 Other intervertebral disc displacement, lumbar region: Secondary | ICD-10-CM | POA: Diagnosis not present

## 2023-09-10 DIAGNOSIS — M48061 Spinal stenosis, lumbar region without neurogenic claudication: Secondary | ICD-10-CM | POA: Diagnosis not present

## 2023-09-20 DIAGNOSIS — E1165 Type 2 diabetes mellitus with hyperglycemia: Secondary | ICD-10-CM | POA: Diagnosis not present

## 2023-09-20 DIAGNOSIS — F339 Major depressive disorder, recurrent, unspecified: Secondary | ICD-10-CM | POA: Diagnosis not present

## 2023-10-06 DIAGNOSIS — Z6836 Body mass index (BMI) 36.0-36.9, adult: Secondary | ICD-10-CM | POA: Diagnosis not present

## 2023-10-06 DIAGNOSIS — M5136 Other intervertebral disc degeneration, lumbar region with discogenic back pain only: Secondary | ICD-10-CM | POA: Diagnosis not present

## 2023-10-06 DIAGNOSIS — M4726 Other spondylosis with radiculopathy, lumbar region: Secondary | ICD-10-CM | POA: Diagnosis not present

## 2023-10-13 DIAGNOSIS — M545 Low back pain, unspecified: Secondary | ICD-10-CM | POA: Diagnosis not present

## 2023-10-16 DIAGNOSIS — M545 Low back pain, unspecified: Secondary | ICD-10-CM | POA: Diagnosis not present

## 2023-10-22 DIAGNOSIS — M545 Low back pain, unspecified: Secondary | ICD-10-CM | POA: Diagnosis not present

## 2023-10-29 DIAGNOSIS — M545 Low back pain, unspecified: Secondary | ICD-10-CM | POA: Diagnosis not present

## 2023-11-03 DIAGNOSIS — M545 Low back pain, unspecified: Secondary | ICD-10-CM | POA: Diagnosis not present

## 2023-11-06 DIAGNOSIS — M545 Low back pain, unspecified: Secondary | ICD-10-CM | POA: Diagnosis not present

## 2023-11-12 DIAGNOSIS — M545 Low back pain, unspecified: Secondary | ICD-10-CM | POA: Diagnosis not present

## 2023-11-14 DIAGNOSIS — M545 Low back pain, unspecified: Secondary | ICD-10-CM | POA: Diagnosis not present

## 2023-11-17 DIAGNOSIS — M545 Low back pain, unspecified: Secondary | ICD-10-CM | POA: Diagnosis not present

## 2023-11-19 DIAGNOSIS — M545 Low back pain, unspecified: Secondary | ICD-10-CM | POA: Diagnosis not present

## 2023-11-20 DIAGNOSIS — E1165 Type 2 diabetes mellitus with hyperglycemia: Secondary | ICD-10-CM | POA: Diagnosis not present

## 2023-11-20 DIAGNOSIS — F339 Major depressive disorder, recurrent, unspecified: Secondary | ICD-10-CM | POA: Diagnosis not present

## 2023-11-24 DIAGNOSIS — M545 Low back pain, unspecified: Secondary | ICD-10-CM | POA: Diagnosis not present

## 2023-11-24 DIAGNOSIS — M4726 Other spondylosis with radiculopathy, lumbar region: Secondary | ICD-10-CM | POA: Diagnosis not present

## 2023-11-24 DIAGNOSIS — Z6836 Body mass index (BMI) 36.0-36.9, adult: Secondary | ICD-10-CM | POA: Diagnosis not present

## 2023-11-27 DIAGNOSIS — M545 Low back pain, unspecified: Secondary | ICD-10-CM | POA: Diagnosis not present

## 2023-12-01 DIAGNOSIS — M545 Low back pain, unspecified: Secondary | ICD-10-CM | POA: Diagnosis not present

## 2023-12-04 DIAGNOSIS — M545 Low back pain, unspecified: Secondary | ICD-10-CM | POA: Diagnosis not present

## 2023-12-07 ENCOUNTER — Other Ambulatory Visit: Payer: Self-pay | Admitting: Medical Genetics

## 2023-12-09 DIAGNOSIS — M545 Low back pain, unspecified: Secondary | ICD-10-CM | POA: Diagnosis not present

## 2023-12-11 DIAGNOSIS — M545 Low back pain, unspecified: Secondary | ICD-10-CM | POA: Diagnosis not present

## 2023-12-17 DIAGNOSIS — E119 Type 2 diabetes mellitus without complications: Secondary | ICD-10-CM | POA: Diagnosis not present

## 2023-12-17 DIAGNOSIS — H2513 Age-related nuclear cataract, bilateral: Secondary | ICD-10-CM | POA: Diagnosis not present

## 2023-12-18 DIAGNOSIS — M5417 Radiculopathy, lumbosacral region: Secondary | ICD-10-CM | POA: Diagnosis not present

## 2023-12-21 DIAGNOSIS — F339 Major depressive disorder, recurrent, unspecified: Secondary | ICD-10-CM | POA: Diagnosis not present

## 2023-12-21 DIAGNOSIS — E1165 Type 2 diabetes mellitus with hyperglycemia: Secondary | ICD-10-CM | POA: Diagnosis not present

## 2023-12-24 DIAGNOSIS — M545 Low back pain, unspecified: Secondary | ICD-10-CM | POA: Diagnosis not present

## 2023-12-24 DIAGNOSIS — E785 Hyperlipidemia, unspecified: Secondary | ICD-10-CM | POA: Diagnosis not present

## 2023-12-24 DIAGNOSIS — Z008 Encounter for other general examination: Secondary | ICD-10-CM | POA: Diagnosis not present

## 2023-12-24 DIAGNOSIS — Z6835 Body mass index (BMI) 35.0-35.9, adult: Secondary | ICD-10-CM | POA: Diagnosis not present

## 2023-12-24 DIAGNOSIS — E1169 Type 2 diabetes mellitus with other specified complication: Secondary | ICD-10-CM | POA: Diagnosis not present

## 2023-12-25 ENCOUNTER — Other Ambulatory Visit: Payer: Self-pay

## 2023-12-25 ENCOUNTER — Ambulatory Visit (HOSPITAL_BASED_OUTPATIENT_CLINIC_OR_DEPARTMENT_OTHER)
Admission: RE | Admit: 2023-12-25 | Discharge: 2023-12-25 | Disposition: A | Discharge status: Home / Self Care | Payer: Self-pay | Source: Ambulatory Visit | Attending: Family Medicine | Admitting: Family Medicine

## 2023-12-25 DIAGNOSIS — E2839 Other primary ovarian failure: Secondary | ICD-10-CM | POA: Insufficient documentation

## 2023-12-25 DIAGNOSIS — Z780 Asymptomatic menopausal state: Secondary | ICD-10-CM | POA: Diagnosis not present

## 2023-12-25 DIAGNOSIS — M545 Low back pain, unspecified: Secondary | ICD-10-CM | POA: Diagnosis not present

## 2023-12-25 DIAGNOSIS — M8589 Other specified disorders of bone density and structure, multiple sites: Secondary | ICD-10-CM | POA: Diagnosis not present

## 2024-01-20 DIAGNOSIS — E1165 Type 2 diabetes mellitus with hyperglycemia: Secondary | ICD-10-CM | POA: Diagnosis not present

## 2024-01-20 DIAGNOSIS — F339 Major depressive disorder, recurrent, unspecified: Secondary | ICD-10-CM | POA: Diagnosis not present

## 2024-02-02 DIAGNOSIS — M4726 Other spondylosis with radiculopathy, lumbar region: Secondary | ICD-10-CM | POA: Diagnosis not present

## 2024-02-02 DIAGNOSIS — Z6836 Body mass index (BMI) 36.0-36.9, adult: Secondary | ICD-10-CM | POA: Diagnosis not present

## 2024-02-11 DIAGNOSIS — F339 Major depressive disorder, recurrent, unspecified: Secondary | ICD-10-CM | POA: Diagnosis not present

## 2024-02-11 DIAGNOSIS — Z79899 Other long term (current) drug therapy: Secondary | ICD-10-CM | POA: Diagnosis not present

## 2024-02-11 DIAGNOSIS — Z23 Encounter for immunization: Secondary | ICD-10-CM | POA: Diagnosis not present

## 2024-02-11 DIAGNOSIS — E1165 Type 2 diabetes mellitus with hyperglycemia: Secondary | ICD-10-CM | POA: Diagnosis not present

## 2024-02-11 DIAGNOSIS — M4807 Spinal stenosis, lumbosacral region: Secondary | ICD-10-CM | POA: Diagnosis not present

## 2024-02-16 ENCOUNTER — Other Ambulatory Visit: Payer: Self-pay | Admitting: Medical Genetics

## 2024-02-16 DIAGNOSIS — Z006 Encounter for examination for normal comparison and control in clinical research program: Secondary | ICD-10-CM

## 2024-04-06 LAB — GENECONNECT MOLECULAR SCREEN: Genetic Analysis Overall Interpretation: NEGATIVE

## 2024-05-28 ENCOUNTER — Other Ambulatory Visit (HOSPITAL_BASED_OUTPATIENT_CLINIC_OR_DEPARTMENT_OTHER): Payer: Self-pay | Admitting: Family Medicine

## 2024-05-28 DIAGNOSIS — Z1231 Encounter for screening mammogram for malignant neoplasm of breast: Secondary | ICD-10-CM

## 2024-05-31 ENCOUNTER — Ambulatory Visit (HOSPITAL_BASED_OUTPATIENT_CLINIC_OR_DEPARTMENT_OTHER): Admitting: Radiology
